# Patient Record
Sex: Female | Born: 1990 | Race: Black or African American | Hispanic: No | Marital: Single | Smoking: Former smoker
Health system: Southern US, Community
[De-identification: ages and names within clinical notes are randomized; demographics above are authoritative.]

## PROBLEM LIST (undated history)

## (undated) ENCOUNTER — Inpatient Hospital Stay (HOSPITAL_COMMUNITY): Payer: Self-pay

## (undated) DIAGNOSIS — J45909 Unspecified asthma, uncomplicated: Secondary | ICD-10-CM

## (undated) DIAGNOSIS — F419 Anxiety disorder, unspecified: Secondary | ICD-10-CM

## (undated) DIAGNOSIS — I10 Essential (primary) hypertension: Secondary | ICD-10-CM

## (undated) DIAGNOSIS — F329 Major depressive disorder, single episode, unspecified: Secondary | ICD-10-CM

## (undated) DIAGNOSIS — F32A Depression, unspecified: Secondary | ICD-10-CM

## (undated) HISTORY — PX: DILATION AND EVACUATION: SHX1459

## (undated) HISTORY — DX: Anxiety disorder, unspecified: F41.9

## (undated) HISTORY — DX: Unspecified asthma, uncomplicated: J45.909

## (undated) HISTORY — PX: NO PAST SURGERIES: SHX2092

---

## 2013-09-02 ENCOUNTER — Other Ambulatory Visit: Payer: Self-pay | Admitting: Nurse Practitioner

## 2014-04-15 DIAGNOSIS — F329 Major depressive disorder, single episode, unspecified: Secondary | ICD-10-CM | POA: Insufficient documentation

## 2014-11-06 ENCOUNTER — Emergency Department (HOSPITAL_COMMUNITY)
Admission: EM | Admit: 2014-11-06 | Discharge: 2014-11-07 | Disposition: A | Discharge status: Home / Self Care | Payer: BC Managed Care – PPO | Attending: Emergency Medicine | Admitting: Emergency Medicine

## 2014-11-06 ENCOUNTER — Encounter (HOSPITAL_COMMUNITY): Payer: Self-pay | Admitting: *Deleted

## 2014-11-06 DIAGNOSIS — T148XXA Other injury of unspecified body region, initial encounter: Secondary | ICD-10-CM

## 2014-11-06 DIAGNOSIS — Y998 Other external cause status: Secondary | ICD-10-CM | POA: Insufficient documentation

## 2014-11-06 DIAGNOSIS — S76012A Strain of muscle, fascia and tendon of left hip, initial encounter: Secondary | ICD-10-CM | POA: Diagnosis not present

## 2014-11-06 DIAGNOSIS — W010XXA Fall on same level from slipping, tripping and stumbling without subsequent striking against object, initial encounter: Secondary | ICD-10-CM | POA: Diagnosis not present

## 2014-11-06 DIAGNOSIS — Z720 Tobacco use: Secondary | ICD-10-CM | POA: Insufficient documentation

## 2014-11-06 DIAGNOSIS — Z3202 Encounter for pregnancy test, result negative: Secondary | ICD-10-CM | POA: Insufficient documentation

## 2014-11-06 DIAGNOSIS — Y9289 Other specified places as the place of occurrence of the external cause: Secondary | ICD-10-CM | POA: Insufficient documentation

## 2014-11-06 DIAGNOSIS — M25559 Pain in unspecified hip: Secondary | ICD-10-CM

## 2014-11-06 DIAGNOSIS — Y9389 Activity, other specified: Secondary | ICD-10-CM | POA: Diagnosis not present

## 2014-11-06 DIAGNOSIS — S8992XA Unspecified injury of left lower leg, initial encounter: Secondary | ICD-10-CM | POA: Insufficient documentation

## 2014-11-06 DIAGNOSIS — S79912A Unspecified injury of left hip, initial encounter: Secondary | ICD-10-CM | POA: Diagnosis present

## 2014-11-06 NOTE — ED Notes (Signed)
Pt reports tripping and falling earlier today.  Reports that she landed in a sitting position.  Pt now reporting pain in left leg, radiating from buttocks to thigh.  No difficulty noted with ambulation.

## 2014-11-07 ENCOUNTER — Emergency Department (HOSPITAL_COMMUNITY): Payer: BC Managed Care – PPO

## 2014-11-07 LAB — POC URINE PREG, ED: Preg Test, Ur: NEGATIVE

## 2014-11-07 MED ORDER — IBUPROFEN 800 MG PO TABS: 800.0000 mg | ORAL_TABLET | Freq: Once | ORAL | Status: DC

## 2014-11-07 MED ORDER — HYDROCODONE-ACETAMINOPHEN 5-325 MG PO TABS: 2.0000 | ORAL_TABLET | Freq: Once | ORAL | Status: DC

## 2014-11-07 MED ORDER — ONDANSETRON HCL 4 MG PO TABS: 4.0000 mg | ORAL_TABLET | Freq: Once | ORAL | Status: DC
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
## 2014-11-07 NOTE — ED Notes (Signed)
Pt decided she did not wish to stay to receive test results or additional treatment.  Encouraged pt to stay but she stated "I have a real long ride ahead of me".

## 2015-01-05 ENCOUNTER — Telehealth: Payer: Self-pay | Admitting: Family Medicine

## 2015-01-05 NOTE — Telephone Encounter (Signed)
Patient aware that our first appointment for a new patient will be in the middle of March and patient states she will continue to look around.

## 2015-06-30 ENCOUNTER — Encounter (HOSPITAL_COMMUNITY): Payer: Self-pay | Admitting: Emergency Medicine

## 2015-06-30 ENCOUNTER — Emergency Department (INDEPENDENT_AMBULATORY_CARE_PROVIDER_SITE_OTHER)
Admission: EM | Admit: 2015-06-30 | Discharge: 2015-06-30 | Disposition: A | Discharge status: Home / Self Care | Payer: Medicaid Other | Source: Home / Self Care | Attending: Emergency Medicine | Admitting: Emergency Medicine

## 2015-06-30 DIAGNOSIS — S01311A Laceration without foreign body of right ear, initial encounter: Secondary | ICD-10-CM

## 2015-06-30 MED ORDER — AMOXICILLIN-POT CLAVULANATE 875-125 MG PO TABS: 1.0000 | ORAL_TABLET | Freq: Two times a day (BID) | ORAL | Status: DC

## 2015-06-30 MED ORDER — TRAMADOL HCL 50 MG PO TABS: 50.0000 mg | ORAL_TABLET | Freq: Four times a day (QID) | ORAL | Status: DC | PRN
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
## 2015-06-30 NOTE — Discharge Instructions (Signed)
Laceration Care, Adult °A laceration is a cut that goes through all layers of the skin. The cut goes into the tissue beneath the skin. °HOME CARE °For wound glue: °· You may shower or take baths. Do not soak or scrub the cut. Do not swim. Avoid heavy sweating until the glue falls off on its own. After a shower or bath, pat the cut dry with a clean towel. °· Do not put medicine on your cut until the glue falls off. °· If you have a bandage, do not put tape over the glue. °· Avoid lots of sunlight or tanning lamps until the glue falls off. Put sunscreen on the cut for the first year to reduce your scar. °· The glue will fall off on its own. Do not pick at the glue. °You may need a tetanus shot if: °· You cannot remember when you had your last tetanus shot. °· You have never had a tetanus shot. °If you need a tetanus shot and you choose not to have one, you may get tetanus. Sickness from tetanus can be serious. °GET HELP RIGHT AWAY IF:  °· Your pain does not get better with medicine. °· Your arm, hand, leg, or foot loses feeling (numbness) or changes color. °· Your cut is bleeding. °· Your joint feels weak, or you cannot use your joint. °· You have painful lumps on your body. °· Your cut is red, puffy (swollen), or painful. °· You have a red line on the skin near the cut. °· You have yellowish-white fluid (pus) coming from the cut. °· You have a fever. °· You have a bad smell coming from the cut or bandage. °· Your cut breaks open before or after stitches are removed. °· You notice something coming out of the cut, such as wood or glass. °· You cannot move a finger or toe. °MAKE SURE YOU:  °· Understand these instructions. °· Will watch your condition. °· Will get help right away if you are not doing well or get worse. °Document Released: 05/30/2008 Document Revised: 03/05/2012 Document Reviewed: 06/07/2011 °Exit[employer]® Patient Information ©2015 [employer]. This information is not intended to replace advice given to  you by your health care provider. Make sure you discuss any questions you have with your health care provider. ° °
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
## 2016-02-11 ENCOUNTER — Encounter (HOSPITAL_COMMUNITY): Payer: Self-pay | Admitting: *Deleted

## 2016-02-11 ENCOUNTER — Inpatient Hospital Stay (HOSPITAL_COMMUNITY): Payer: BLUE CROSS/BLUE SHIELD

## 2016-02-11 ENCOUNTER — Inpatient Hospital Stay (HOSPITAL_COMMUNITY)
Admission: AD | Admit: 2016-02-11 | Discharge: 2016-02-11 | Disposition: A | Discharge status: Home / Self Care | Payer: BLUE CROSS/BLUE SHIELD | Source: Ambulatory Visit | Attending: Obstetrics & Gynecology | Admitting: Obstetrics & Gynecology

## 2016-02-11 DIAGNOSIS — F17210 Nicotine dependence, cigarettes, uncomplicated: Secondary | ICD-10-CM | POA: Diagnosis not present

## 2016-02-11 DIAGNOSIS — O9989 Other specified diseases and conditions complicating pregnancy, childbirth and the puerperium: Secondary | ICD-10-CM

## 2016-02-11 DIAGNOSIS — O26899 Other specified pregnancy related conditions, unspecified trimester: Secondary | ICD-10-CM

## 2016-02-11 DIAGNOSIS — R1030 Lower abdominal pain, unspecified: Secondary | ICD-10-CM | POA: Diagnosis present

## 2016-02-11 DIAGNOSIS — R109 Unspecified abdominal pain: Secondary | ICD-10-CM | POA: Diagnosis not present

## 2016-02-11 DIAGNOSIS — Z3A01 Less than 8 weeks gestation of pregnancy: Secondary | ICD-10-CM | POA: Diagnosis not present

## 2016-02-11 DIAGNOSIS — O26891 Other specified pregnancy related conditions, first trimester: Secondary | ICD-10-CM | POA: Diagnosis not present

## 2016-02-11 LAB — CBC WITH DIFFERENTIAL/PLATELET
BASOS ABS: 0 10*3/uL (ref 0.0–0.1)
BASOS PCT: 0 %
EOS ABS: 0.2 10*3/uL (ref 0.0–0.7)
Eosinophils Relative: 2 %
HEMATOCRIT: 39.1 % (ref 36.0–46.0)
HEMOGLOBIN: 13.9 g/dL (ref 12.0–15.0)
Lymphocytes Relative: 28 %
Lymphs Abs: 2.3 10*3/uL (ref 0.7–4.0)
MCH: 30.3 pg (ref 26.0–34.0)
MCHC: 35.5 g/dL (ref 30.0–36.0)
MCV: 85.4 fL (ref 78.0–100.0)
MONOS PCT: 5 %
Monocytes Absolute: 0.4 10*3/uL (ref 0.1–1.0)
Neutro Abs: 5.1 10*3/uL (ref 1.7–7.7)
Neutrophils Relative %: 65 %
Platelets: 288 10*3/uL (ref 150–400)
RBC: 4.58 MIL/uL (ref 3.87–5.11)
RDW: 12.2 % (ref 11.5–15.5)
WBC: 8 10*3/uL (ref 4.0–10.5)

## 2016-02-11 LAB — HCG, QUANTITATIVE, PREGNANCY: hCG, Beta Chain, Quant, S: 37435 m[IU]/mL — ABNORMAL HIGH (ref <5)

## 2016-02-11 LAB — WET PREP, GENITAL
Sperm: NONE SEEN
Trich, Wet Prep: NONE SEEN
YEAST WET PREP: NONE SEEN

## 2016-02-11 LAB — URINALYSIS, ROUTINE W REFLEX MICROSCOPIC
Bilirubin Urine: NEGATIVE
Glucose, UA: NEGATIVE mg/dL
Hgb urine dipstick: NEGATIVE
KETONES UR: 15 mg/dL — AB
Leukocytes, UA: NEGATIVE
NITRITE: NEGATIVE
PROTEIN: NEGATIVE mg/dL
Specific Gravity, Urine: 1.02 (ref 1.005–1.030)
pH: 7 (ref 5.0–8.0)

## 2016-02-11 LAB — POCT PREGNANCY, URINE: Preg Test, Ur: POSITIVE — AB

## 2016-02-11 NOTE — MAU Note (Signed)
PT SAYS   SHE STARTED HAVING  ABD PAIN ON MON-   NOT ALL TIME  -  NONE  NOW.   NO BLEEDING.   NO BIRTH CONTROL.   LAST SEX-  2 WEEKS AGO.    HPT DONE  ON 1-25  - POSITIVE   X2  .     NO DR.

## 2016-02-11 NOTE — Discharge Instructions (Signed)
Prenatal Care Providers °[facility]    [facility] ° Phone- [phone]     Phone: [phone] °         °[facility]                      [facility] ° @[place]     Phone: [phone] ° Phone: [phone] °        [facility] °[facility]     Phone: [phone] ° Phone: [phone]   °        [facility] °[facility] [place]                hone: [phone] ° Phone: [phone] °        [facility] °Dr. Bernard Marshall      Phone: [phone] ° Phone: [phone] °        [facility] °[facility]                Phone: [phone] ° [facility]  ° Phone:[phone]    [facility] °         Phone: [phone] °[facility] °  Phone: [phone] °Safe Medications in Pregnancy  ° °Acne: °Benzoyl Peroxide °Salicylic Acid ° °Backache/Headache: °Tylenol: 2 regular strength every 4 hours OR °             2 Extra strength every 6 hours ° °Colds/Coughs/Allergies: °Benadryl (alcohol free) 25 mg every 6 hours as needed °Breath right strips °Claritin °Cepacol throat lozenges °Chloraseptic throat spray °Cold-Eeze- up to three times per day °Cough drops, alcohol free °Flonase (by prescription only) °Guaifenesin °Mucinex °Robitussin DM (plain only, alcohol free) °Saline nasal spray/drops °Sudafed (pseudoephedrine) & Actifed ** use only after [redacted] weeks gestation and if you do not have high blood pressure °Tylenol °Vicks Vaporub °Zinc lozenges °Zyrtec  ° °Constipation: °Colace °Ducolax suppositories °Fleet enema °Glycerin suppositories °Metamucil °Milk of magnesia °Miralax °Senokot °Smooth move tea ° °Diarrhea: °Kaopectate °Imodium A-D ° °*NO pepto Bismol ° °Hemorrhoids: °Anusol °Anusol HC °Preparation H °Tucks ° °Indigestion: °Tums °Maalox °Mylanta °Zantac  °Pepcid ° °Insomnia: °Benadryl (alcohol free) 25mg every 6 hours as needed °Tylenol  PM °Unisom, no Gelcaps ° °Leg Cramps: °Tums °MagGel ° °Nausea/Vomiting:  °Bonine °Dramamine °Emetrol °Ginger extract °Sea bands °Meclizine  °Nausea medication to take during pregnancy:  °Unisom (doxylamine succinate 25 mg tablets) Take one tablet daily at bedtime. If symptoms are not adequately controlled, the dose can be increased to a maximum recommended dose of two tablets daily (1/2 tablet in the morning, 1/2 tablet mid-afternoon and one at bedtime). °Vitamin B6 100mg tablets. Take one tablet twice a day (up to 200 mg per day). ° °Skin Rashes: °Aveeno products °Benadryl cream or 25mg every 6 hours as needed °Calamine Lotion °1% cortisone cream ° °Yeast infection: °Gyne-lotrimin 7 °Monistat 7 ° ° °**If taking multiple medications, please check labels to avoid duplicating the same active ingredients °**take medication as directed on the label °** Do not exceed 4000 mg of tylenol in 24 hours °**Do not take medications that contain aspirin or ibuprofen ° ° ° ° °

## 2016-02-11 NOTE — MAU Provider Note (Signed)
History     CSN: [id number]  Arrival date and time: 02/11/16 7829   First Provider Initiated Contact with Patient 02/11/16 2025      Chief Complaint  Patient presents with  . Abdominal Pain   HPI  [name] is a 25 y.o. [id number] at [redacted]w[redacted]d who presents to MAU today with complaint of lower abdominal pain and +HPT. The patient states recent SAB, so lower abdominal pain was concerning for similar problem with this pregnancy. She state midline lower abdominal pain recently, although none now. She denies vaginal bleeding, discharge, UTI symptoms, fever, N/V/D or constipation. She states LMP was approximately 12/31/15.   OB History    Gravida Para Term Preterm AB TAB SAB Ectopic Multiple Living   History reviewed. No pertinent past medical history.  History reviewed. No pertinent past surgical history.  History reviewed. No pertinent family history.  Social History  Substance Use Topics  . Smoking status: Current Some Day Smoker -- 0.50 packs/day  . Smokeless tobacco: None  . Alcohol Use: No    Allergies: No Known Allergies  Prescriptions prior to admission  Medication Sig Dispense Refill Last Dose  . amoxicillin-clavulanate (AUGMENTIN) 875-125 MG per tablet Take 1 tablet by mouth 2 (two) times daily. 20 tablet 0   . traMADol (ULTRAM) 50 MG tablet Take 1 tablet (50 mg total) by mouth every 6 (six) hours as needed. 15 tablet 0     Review of Systems  Constitutional: Negative for fever and malaise/fatigue.  Gastrointestinal: Positive for abdominal pain. Negative for nausea, vomiting, diarrhea and constipation.  Genitourinary: Negative for dysuria, urgency and frequency.       Neg - vaginal bleeding, discharge   Physical Exam   Blood pressure 148/99, pulse 92, temperature 98.3 F (36.8 C), temperature source Oral, resp. rate 20, height  (1.626 m), weight 161 lb 8 oz (73.256 kg), last menstrual period 12/31/2015.  Physical Exam  Nursing  note and vitals reviewed. Constitutional: She is oriented to person, place, and time. She appears well-developed and well-nourished. No distress.  HENT:  Head: Normocephalic and atraumatic.  Cardiovascular: Normal rate.   Respiratory: Effort normal.  GI: Soft. She exhibits no distension and no mass. There is tenderness (mild tedneress to palpation of the lower abdomen at midline). There is no rebound and no guarding.  Genitourinary: Uterus is not enlarged and not tender. Cervix exhibits no motion tenderness. Right adnexum displays no mass and no tenderness. Left adnexum displays no mass and no tenderness. No bleeding in the vagina. Vaginal discharge (scant thin, white discharge) found.  Neurological: She is alert and oriented to person, place, and time.  Skin: Skin is warm and dry. No erythema.  Psychiatric: She has a normal mood and affect.   Results for orders placed or performed during the hospital encounter of 02/11/16 (from the past 24 hour(s))  Urinalysis, Routine w reflex microscopic (not at [facility])     Status: Abnormal   Collection Time: 02/11/16  7:45 PM  Result Value Ref Range   Color, Urine YELLOW YELLOW   APPearance CLEAR CLEAR   Specific Gravity, Urine 1.020 1.005 - 1.030   pH 7.0 5.0 - 8.0   Glucose, UA NEGATIVE NEGATIVE mg/dL   Hgb urine dipstick NEGATIVE NEGATIVE   Bilirubin Urine NEGATIVE NEGATIVE   Ketones, ur 15 (A) NEGATIVE mg/dL   Protein, ur NEGATIVE NEGATIVE mg/dL   Nitrite NEGATIVE NEGATIVE  Leukocytes, UA NEGATIVE NEGATIVE  Pregnancy, urine POC     Status: Abnormal   Collection Time: 02/11/16  8:15 PM  Result Value Ref Range   Preg Test, Ur POSITIVE (A) NEGATIVE  CBC with Differential/Platelet     Status: None   Collection Time: 02/11/16  8:29 PM  Result Value Ref Range   WBC 8.0 4.0 - 10.5 K/uL   RBC 4.58 3.87 - 5.11 MIL/uL   Hemoglobin 13.9 12.0 - 15.0 g/dL   HCT 56.2 13.0 - 86.5 %   MCV 85.4 78.0 - 100.0 fL   MCH 30.3 26.0 - 34.0 pg   MCHC 35.5 30.0  - 36.0 g/dL   RDW 78.4 69.6 - 29.5 %   Platelets 288 150 - 400 K/uL   Neutrophils Relative % 65 %   Neutro Abs 5.1 1.7 - 7.7 K/uL   Lymphocytes Relative 28 %   Lymphs Abs 2.3 0.7 - 4.0 K/uL   Monocytes Relative 5 %   Monocytes Absolute 0.4 0.1 - 1.0 K/uL   Eosinophils Relative 2 %   Eosinophils Absolute 0.2 0.0 - 0.7 K/uL   Basophils Relative 0 %   Basophils Absolute 0.0 0.0 - 0.1 K/uL  ABO/Rh     Status: None (Preliminary result)   Collection Time: 02/11/16  8:29 PM  Result Value Ref Range   ABO/RH(D) O POS   hCG, quantitative, pregnancy     Status: Abnormal   Collection Time: 02/11/16  8:29 PM  Result Value Ref Range   hCG, Beta Chain, Quant, S 37435 (H) <5 mIU/mL  Wet prep, genital     Status: Abnormal   Collection Time: 02/11/16  8:55 PM  Result Value Ref Range   Yeast Wet Prep HPF POC NONE SEEN NONE SEEN   Trich, Wet Prep NONE SEEN NONE SEEN   Clue Cells Wet Prep HPF POC PRESENT (A) NONE SEEN   WBC, Wet Prep HPF POC FEW (A) NONE SEEN   Sperm NONE SEEN    US Ob Comp Less 14 Wks  02/11/2016  CLINICAL DATA:  Abdominal pain in first-trimester pregnancy EXAM: OBSTETRIC <14 WK [place] AND TRANSVAGINAL OB US TECHNIQUE: Both transabdominal and transvaginal ultrasound examinations were performed for complete evaluation of the gestation as well as the maternal uterus, adnexal regions, and pelvic cul-de-sac. Transvaginal technique was performed to assess early pregnancy. COMPARISON:  None. FINDINGS: Intrauterine gestational sac: Visualized/normal in shape. Yolk sac:  Present Embryo:  Present Cardiac Activity: Present Heart Rate: 122  bpm CRL:  6  mm   6 w   3 d                  [place] EDC: 10/03/2016 Subchorionic hemorrhage:  Present inferiorly, 19 x 6 x 32 mm Maternal uterus/adnexae: 12 mm intramural fibroid noted, closest to the serosal surface. Corpus luteum on the right. IMPRESSION: 1. Single living intrauterine pregnancy measuring 6 weeks 3 days. 2. 6 x 19 x 32 mm subchorionic hematoma. 3. 12 mm  intramural fibroid. Electronically Signed   By: Marnee Spring M.D.   On: 02/11/2016 21:58   US Ob Transvaginal  02/11/2016  CLINICAL DATA:  Abdominal pain in first-trimester pregnancy EXAM: OBSTETRIC <14 WK [place] AND TRANSVAGINAL OB US TECHNIQUE: Both transabdominal and transvaginal ultrasound examinations were performed for complete evaluation of the gestation as well as the maternal uterus, adnexal regions, and pelvic cul-de-sac. Transvaginal technique was performed to assess early pregnancy. COMPARISON:  None. FINDINGS: Intrauterine gestational sac: Visualized/normal in shape. Yolk  sac:  Present Embryo:  Present Cardiac Activity: Present Heart Rate: 122  bpm CRL:  6  mm   6 w   3 d                  [place] EDC: 10/03/2016 Subchorionic hemorrhage:  Present inferiorly, 19 x 6 x 32 mm Maternal uterus/adnexae: 12 mm intramural fibroid noted, closest to the serosal surface. Corpus luteum on the right. IMPRESSION: 1. Single living intrauterine pregnancy measuring 6 weeks 3 days. 2. 6 x 19 x 32 mm subchorionic hematoma. 3. 12 mm intramural fibroid. Electronically Signed   By: Marnee Spring M.D.   On: 02/11/2016 21:58     MAU Course  Procedures None   MDM +UPT UA, wet prep, GC/chlamydia, CBC, ABO/Rh, quant hCG, HIV, RPR and [place] today to rule out ectopic pregnancy 2100 - Labs pending. Patient waiting for [place]. Care turned over to [facility], CNM   Marny Lowenstein, PA-C  02/11/2016, 9:00 PM  Assessment and Plan   1. Abdominal pain affecting pregnancy, antepartum    DC home Comfort measures reviewed  1st Trimester precautions  Bleeding precautions RX: none  Return to MAU as needed FU with OB as planned  Follow-up Information    Schedule an appointment as soon as possible for a visit with [facility].   Contact information:   [address] [place] [address] [phone]

## 2016-02-12 LAB — GC/CHLAMYDIA PROBE AMP (~~LOC~~) NOT AT ARMC
CHLAMYDIA, DNA PROBE: NEGATIVE
Neisseria Gonorrhea: NEGATIVE

## 2016-02-12 LAB — ABO/RH: ABO/RH(D): O POS

## 2016-02-12 LAB — SYPHILIS: RPR W/REFLEX TO RPR TITER AND TREPONEMAL ANTIBODIES, TRADITIONAL SCREENING AND DIAGNOSIS ALGORITHM: RPR Ser Ql: NONREACTIVE

## 2016-02-12 LAB — HIV ANTIBODY (ROUTINE TESTING W REFLEX): HIV Screen 4th Generation wRfx: NONREACTIVE

## 2016-03-03 ENCOUNTER — Inpatient Hospital Stay (HOSPITAL_COMMUNITY)
Admission: AD | Admit: 2016-03-03 | Discharge: 2016-03-04 | Disposition: A | Discharge status: Home / Self Care | Payer: BLUE CROSS/BLUE SHIELD | Source: Ambulatory Visit | Attending: Obstetrics & Gynecology | Admitting: Obstetrics & Gynecology

## 2016-03-03 DIAGNOSIS — R079 Chest pain, unspecified: Secondary | ICD-10-CM | POA: Diagnosis present

## 2016-03-03 DIAGNOSIS — Z87891 Personal history of nicotine dependence: Secondary | ICD-10-CM | POA: Diagnosis not present

## 2016-03-03 DIAGNOSIS — O10011 Pre-existing essential hypertension complicating pregnancy, first trimester: Secondary | ICD-10-CM | POA: Diagnosis not present

## 2016-03-03 DIAGNOSIS — O131 Gestational [pregnancy-induced] hypertension without significant proteinuria, first trimester: Secondary | ICD-10-CM

## 2016-03-03 DIAGNOSIS — Z3A09 9 weeks gestation of pregnancy: Secondary | ICD-10-CM | POA: Diagnosis not present

## 2016-03-03 DIAGNOSIS — I10 Essential (primary) hypertension: Secondary | ICD-10-CM

## 2016-03-03 LAB — TROPONIN I

## 2016-03-03 MED ORDER — LABETALOL HCL 100 MG PO TABS
100.0000 mg | ORAL_TABLET | Freq: Once | ORAL | Status: AC
Start: 2016-03-03 — End: 2016-03-03
  Administered 2016-03-03: 100 mg via ORAL
  Filled 2016-03-03: qty 1

## 2016-03-03 MED ORDER — LABETALOL HCL 100 MG PO TABS: 100.0000 mg | ORAL_TABLET | Freq: Two times a day (BID) | ORAL | Status: DC

## 2016-03-03 MED ORDER — LABETALOL HCL 100 MG PO TABS: 200.0000 mg | ORAL_TABLET | Freq: Once | ORAL | Status: DC

## 2016-03-03 NOTE — MAU Note (Signed)
Urine in lab 

## 2016-03-03 NOTE — MAU Provider Note (Signed)
History     CSN: [id number]  Arrival date and time: 03/03/16 1850   First Provider Initiated Contact with Patient 03/03/16 2050      Chief Complaint  Patient presents with  . Chest Pain   HPI Comments: Deborah Klein is a 25 y.o. [id number] at [redacted]w[redacted]d who presents today with chest pain. She states that she has had the pain for about 2 weeks, but the last 2 days it has been increasing in frequency. She states that every time she is at the doctor she is told that her blood pressure is "a little high".   Chest Pain  This is a new problem. Episode onset: 2 weeks ago. The onset quality is gradual. The problem occurs intermittently (but increasing in frequency in the last 1-2 days ). The pain is present in the lateral region. The pain is at a severity of 7/10. The quality of the pain is described as squeezing and sharp. The pain radiates to the left arm. Associated symptoms include headaches, nausea and vomiting. Pertinent negatives include no cough. Cough associated with: quit smokig about 2 weeks ago.  The pain is aggravated by nothing. She has tried nothing for the symptoms. Risk factors include smoking/tobacco exposure (pregnancy ).   No past medical history on file.  No past surgical history on file.  No family history on file.  Social History  Substance Use Topics  . Smoking status: Current Some Day Smoker -- 0.50 packs/day  . Smokeless tobacco: Not on file  . Alcohol Use: No    Allergies: No Known Allergies  No prescriptions prior to admission    Review of Systems  Eyes: Negative for blurred vision.  Respiratory: Negative for cough.   Cardiovascular: Positive for chest pain.  Gastrointestinal: Positive for nausea and vomiting.  Neurological: Positive for headaches.   Physical Exam   Blood pressure 150/95, pulse 70, temperature 97.9 F (36.6 C), temperature source Oral, resp. rate 16, height  (1.676 m), weight 75.354 kg (166 lb 2 oz), last menstrual period 12/31/2015,  SpO2 100 %.  Physical Exam  Nursing note and vitals reviewed. Constitutional: She is oriented to person, place, and time. She appears well-developed and well-nourished. No distress.  HENT:  Head: Normocephalic.  Cardiovascular: Normal rate.   Respiratory: Effort normal.  GI: Soft. There is no tenderness. There is no rebound.  Neurological: She is alert and oriented to person, place, and time.  Skin: Skin is warm and dry.  Psychiatric: She has a normal mood and affect.   Results for orders placed or performed during the hospital encounter of 03/03/16 (from the past 24 hour(s))  Troponin I     Status: None   Collection Time: 03/03/16  9:50 PM  Result Value Ref Range   Troponin I <0.03 <0.031 ng/mL    MAU Course  Procedures  MDM 2112: D/W Dr. Penne Lash, will give 100 mg labetalol now, and can be dc home on  BID if cardiology signs off on EKG 2146: D/W Dr. Nicholaus Bloom reviewed ECG. He feels that she will need an echo. Will get troponin now, and repeat ECG with any changes or if pain improved.  2254: Patient reports that pain is now 4/10, was 7/10 when first arrived.  2352: Patient reports that her pain is now 0/10  2354: D/W Dr. Nicholaus Bloom, ok for DC home. Patient to FU with cardiology for outpatient FU.  Assessment and Plan   1. Essential hypertension    DC home Comfort measures reviewed  1st Trimester precautions  Warning signs and return precautions reviewed  RX: labetalol 100mg  BID  Return to MAU as needed for emergencies.    Follow-up Information    Follow [facility] with [facility].   Specialty:  Obstetrics and Gynecology   Why:  They will call you with an appointment   Contact information:   [address] [place] [id number] [phone]      Schedule an appointment as soon as possible for a visit with [facility].   Contact information:   [address] [place]  [phone]      Tawnya CrookHogan, [place] Zachow Donovan 03/03/2016, 8:51 PM

## 2016-03-03 NOTE — MAU Note (Signed)
Pt reports she has been having chest pain off/on for the last 3 weeks, states it feels like a tightness in her chest but it is worsening for the last 24 hours, pt is texting during triage conversation. Has a history of asthma and states she sometimes has shortness of breath with the pain.

## 2016-03-03 NOTE — MAU Note (Signed)
Not in lobby

## 2016-03-03 NOTE — Discharge Instructions (Signed)
Hypertension Hypertension, commonly called high blood pressure, is when the force of blood pumping through your arteries is too strong. Your arteries are the blood vessels that carry blood from your heart throughout your body. A blood pressure reading consists of a higher number over a lower number, such as 110/72. The higher number (systolic) is the pressure inside your arteries when your heart pumps. The lower number (diastolic) is the pressure inside your arteries when your heart relaxes. Ideally you want your blood pressure below 120/80. Hypertension forces your heart to work harder to pump blood. Your arteries may become narrow or stiff. Having untreated or uncontrolled hypertension can cause heart attack, stroke, kidney disease, and other problems. RISK FACTORS Some risk factors for high blood pressure are controllable. Others are not.  Risk factors you cannot control include:   Race. You may be at higher risk if you are African American.  Age. Risk increases with age.  Gender. Men are at higher risk than women before age 45 years. After age 65, women are at higher risk than men. Risk factors you can control include:  Not getting enough exercise or physical activity.  Being overweight.  Getting too much fat, sugar, calories, or salt in your diet.  Drinking too much alcohol. SIGNS AND SYMPTOMS Hypertension does not usually cause signs or symptoms. Extremely high blood pressure (hypertensive crisis) may cause headache, anxiety, shortness of breath, and nosebleed. DIAGNOSIS To check if you have hypertension, your health care provider will measure your blood pressure while you are seated, with your arm held at the level of your heart. It should be measured at least twice using the same arm. Certain conditions can cause a difference in blood pressure between your right and left arms. A blood pressure reading that is higher than normal on one occasion does not mean that you need treatment. If  it is not clear whether you have high blood pressure, you may be asked to return on a different day to have your blood pressure checked again. Or, you may be asked to monitor your blood pressure at home for 1 or more weeks. TREATMENT Treating high blood pressure includes making lifestyle changes and possibly taking medicine. Living a healthy lifestyle can help lower high blood pressure. You may need to change some of your habits. Lifestyle changes may include:  Following the DASH diet. This diet is high in fruits, vegetables, and whole grains. It is low in salt, red meat, and added sugars.  Keep your sodium intake below 2,300 mg per day.  Getting at least 30-45 minutes of aerobic exercise at least 4 times per week.  Losing weight if necessary.  Not smoking.  Limiting alcoholic beverages.  Learning ways to reduce stress. Your health care provider may prescribe medicine if lifestyle changes are not enough to get your blood pressure under control, and if one of the following is true:  You are 18-59 years of age and your systolic blood pressure is above 140.  You are 60 years of age or older, and your systolic blood pressure is above 150.  Your diastolic blood pressure is above 90.  You have diabetes, and your systolic blood pressure is over 140 or your diastolic blood pressure is over 90.  You have kidney disease and your blood pressure is above 140/90.  You have heart disease and your blood pressure is above 140/90. Your personal target blood pressure may vary depending on your medical conditions, your age, and other factors. HOME CARE INSTRUCTIONS    Have your blood pressure rechecked as directed by your health care provider.   Take medicines only as directed by your health care provider. Follow the directions carefully. Blood pressure medicines must be taken as prescribed. The medicine does not work as well when you skip doses. Skipping doses also puts you at risk for  problems.  Do not smoke.   Monitor your blood pressure at home as directed by your health care provider. SEEK MEDICAL CARE IF:   You think you are having a reaction to medicines taken.  You have recurrent headaches or feel dizzy.  You have swelling in your ankles.  You have trouble with your vision. SEEK IMMEDIATE MEDICAL CARE IF:  You develop a severe headache or confusion.  You have unusual weakness, numbness, or feel faint.  You have severe chest or abdominal pain.  You vomit repeatedly.  You have trouble breathing. MAKE SURE YOU:   Understand these instructions.  Will watch your condition.  Will get help right away if you are not doing well or get worse.   This information is not intended to replace advice given to you by your health care provider. Make sure you discuss any questions you have with your health care provider.   Document Released: 12/12/2005 Document Revised: 04/28/2015 Document Reviewed: 10/04/2013 Elsevier Interactive Patient Education 2016 [employer] First Trimester of Pregnancy The first trimester of pregnancy is from week 1 until the end of week 12 (months 1 through 3). A week after a sperm fertilizes an egg, the egg will implant on the wall of the uterus. This embryo will begin to develop into a baby. Genes from you and your partner are forming the baby. The female genes determine whether the baby is a boy or a girl. At 6-8 weeks, the eyes and face are formed, and the heartbeat can be seen on ultrasound. At the end of 12 weeks, all the baby's organs are formed.  Now that you are pregnant, you will want to do everything you can to have a healthy baby. Two of the most important things are to get good prenatal care and to follow your health care provider's instructions. Prenatal care is all the medical care you receive before the baby's birth. This care will help prevent, find, and treat any problems during the pregnancy and childbirth. BODY  CHANGES Your body goes through many changes during pregnancy. The changes vary from woman to woman.   You may gain or lose a couple of pounds at first.  You may feel sick to your stomach (nauseous) and throw up (vomit). If the vomiting is uncontrollable, call your health care provider.  You may tire easily.  You may develop headaches that can be relieved by medicines approved by your health care provider.  You may urinate more often. Painful urination may mean you have a bladder infection.  You may develop heartburn as a result of your pregnancy.  You may develop constipation because certain hormones are causing the muscles that push waste through your intestines to slow down.  You may develop hemorrhoids or swollen, bulging veins (varicose veins).  Your breasts may begin to grow larger and become tender. Your nipples may stick out more, and the tissue that surrounds them (areola) may become darker.  Your gums may bleed and may be sensitive to brushing and flossing.  Dark spots or blotches (chloasma, mask of pregnancy) may develop on your face. This will likely fade after the baby is born.  Your menstrual periods  will stop.  You may have a loss of appetite.  You may develop cravings for certain kinds of food.  You may have changes in your emotions from day to day, such as being excited to be pregnant or being concerned that something may go wrong with the pregnancy and baby.  You may have more vivid and strange dreams.  You may have changes in your hair. These can include thickening of your hair, rapid growth, and changes in texture. Some women also have hair loss during or after pregnancy, or hair that feels dry or thin. Your hair will most likely return to normal after your baby is born. WHAT TO EXPECT AT YOUR PRENATAL VISITS During a routine prenatal visit:  You will be weighed to make sure you and the baby are growing normally.  Your blood pressure will be taken.  Your  abdomen will be measured to track your baby's growth.  The fetal heartbeat will be listened to starting around week 10 or 12 of your pregnancy.  Test results from any previous visits will be discussed. Your health care provider may ask you:  How you are feeling.  If you are feeling the baby move.  If you have had any abnormal symptoms, such as leaking fluid, bleeding, severe headaches, or abdominal cramping.  If you are using any tobacco products, including cigarettes, chewing tobacco, and electronic cigarettes.  If you have any questions. Other tests that may be performed during your first trimester include:  Blood tests to find your blood type and to check for the presence of any previous infections. They will also be used to check for low iron levels (anemia) and Rh antibodies. Later in the pregnancy, blood tests for diabetes will be done along with other tests if problems develop.  Urine tests to check for infections, diabetes, or protein in the urine.  An ultrasound to confirm the proper growth and development of the baby.  An amniocentesis to check for possible genetic problems.  Fetal screens for spina bifida and Down syndrome.  You may need other tests to make sure you and the baby are doing well.  HIV (human immunodeficiency virus) testing. Routine prenatal testing includes screening for HIV, unless you choose not to have this test. HOME CARE INSTRUCTIONS  Medicines  Follow your health care provider's instructions regarding medicine use. Specific medicines may be either safe or unsafe to take during pregnancy.  Take your prenatal vitamins as directed.  If you develop constipation, try taking a stool softener if your health care provider approves. Diet  Eat regular, well-balanced meals. Choose a variety of foods, such as meat or vegetable-based protein, fish, milk and low-fat dairy products, vegetables, fruits, and whole grain breads and cereals. Your health care  provider will help you determine the amount of weight gain that is right for you.  Avoid raw meat and uncooked cheese. These carry germs that can cause birth defects in the baby.  Eating four or five small meals rather than three large meals a day may help relieve nausea and vomiting. If you start to feel nauseous, eating a few soda crackers can be helpful. Drinking liquids between meals instead of during meals also seems to help nausea and vomiting.  If you develop constipation, eat more high-fiber foods, such as fresh vegetables or fruit and whole grains. Drink enough fluids to keep your urine clear or pale yellow. Activity and Exercise  Exercise only as directed by your health care provider. Exercising will help you:  Control your weight.  Stay in shape.  Be prepared for labor and delivery.  Experiencing pain or cramping in the lower abdomen or low back is a good sign that you should stop exercising. Check with your health care provider before continuing normal exercises.  Try to avoid standing for long periods of time. Move your legs often if you must stand in one place for a long time.  Avoid heavy lifting.  Wear low-heeled shoes, and practice good posture.  You may continue to have sex unless your health care provider directs you otherwise. Relief of Pain or Discomfort  Wear a good support bra for breast tenderness.   Take warm sitz baths to soothe any pain or discomfort caused by hemorrhoids. Use hemorrhoid cream if your health care provider approves.   Rest with your legs elevated if you have leg cramps or low back pain.  If you develop varicose veins in your legs, wear support hose. Elevate your feet for 15 minutes, 3-4 times a day. Limit salt in your diet. Prenatal Care  Schedule your prenatal visits by the twelfth week of pregnancy. They are usually scheduled monthly at first, then more often in the last 2 months before delivery.  Write down your questions. Take  them to your prenatal visits.  Keep all your prenatal visits as directed by your health care provider. Safety  Wear your seat belt at all times when driving.  Make a list of emergency phone numbers, including numbers for family, friends, the hospital, and police and fire departments. General Tips  Ask your health care provider for a referral to a local prenatal education class. Begin classes no later than at the beginning of month 6 of your pregnancy.  Ask for help if you have counseling or nutritional needs during pregnancy. Your health care provider can offer advice or refer you to specialists for help with various needs.  Do not use hot tubs, steam rooms, or saunas.  Do not douche or use tampons or scented sanitary pads.  Do not cross your legs for long periods of time.  Avoid cat litter boxes and soil used by cats. These carry germs that can cause birth defects in the baby and possibly loss of the fetus by miscarriage or stillbirth.  Avoid all smoking, herbs, alcohol, and medicines not prescribed by your health care provider. Chemicals in these affect the formation and growth of the baby.  Do not use any tobacco products, including cigarettes, chewing tobacco, and electronic cigarettes. If you need help quitting, ask your health care provider. You may receive counseling support and other resources to help you quit.  Schedule a dentist appointment. At home, brush your teeth with a soft toothbrush and be gentle when you floss. SEEK MEDICAL CARE IF:   You have dizziness.  You have mild pelvic cramps, pelvic pressure, or nagging pain in the abdominal area.  You have persistent nausea, vomiting, or diarrhea.  You have a bad smelling vaginal discharge.  You have pain with urination.  You notice increased swelling in your face, hands, legs, or ankles. SEEK IMMEDIATE MEDICAL CARE IF:   You have a fever.  You are leaking fluid from your vagina.  You have spotting or bleeding  from your vagina.  You have severe abdominal cramping or pain.  You have rapid weight gain or loss.  You vomit blood or material that looks like coffee grounds.  You are exposed to [place] measles and have never had them.  You are exposed  to fifth disease or chickenpox.  You develop a severe headache.  You have shortness of breath.  You have any kind of trauma, such as from a fall or a car accident.   This information is not intended to replace advice given to you by your health care provider. Make sure you discuss any questions you have with your health care provider.   Document Released: 12/06/2001 Document Revised: 01/02/2015 Document Reviewed: 10/22/2013 Elsevier Interactive Patient Education [employer]

## 2016-03-03 NOTE — MAU Note (Signed)
EKG in triage

## 2016-03-24 ENCOUNTER — Encounter: Payer: BLUE CROSS/BLUE SHIELD | Admitting: Obstetrics and Gynecology

## 2016-06-20 ENCOUNTER — Emergency Department (HOSPITAL_COMMUNITY): Payer: Medicaid Other

## 2016-06-20 ENCOUNTER — Emergency Department (HOSPITAL_COMMUNITY)
Admission: EM | Admit: 2016-06-20 | Discharge: 2016-06-20 | Disposition: A | Discharge status: Home / Self Care | Payer: Medicaid Other | Attending: Emergency Medicine | Admitting: Emergency Medicine

## 2016-06-20 ENCOUNTER — Encounter (HOSPITAL_COMMUNITY): Payer: Self-pay | Admitting: Emergency Medicine

## 2016-06-20 DIAGNOSIS — Z3201 Encounter for pregnancy test, result positive: Secondary | ICD-10-CM | POA: Diagnosis not present

## 2016-06-20 DIAGNOSIS — N760 Acute vaginitis: Secondary | ICD-10-CM

## 2016-06-20 DIAGNOSIS — Z3A01 Less than 8 weeks gestation of pregnancy: Secondary | ICD-10-CM | POA: Insufficient documentation

## 2016-06-20 DIAGNOSIS — I10 Essential (primary) hypertension: Secondary | ICD-10-CM | POA: Insufficient documentation

## 2016-06-20 DIAGNOSIS — N898 Other specified noninflammatory disorders of vagina: Secondary | ICD-10-CM | POA: Insufficient documentation

## 2016-06-20 DIAGNOSIS — O99331 Smoking (tobacco) complicating pregnancy, first trimester: Secondary | ICD-10-CM | POA: Diagnosis not present

## 2016-06-20 DIAGNOSIS — F17200 Nicotine dependence, unspecified, uncomplicated: Secondary | ICD-10-CM | POA: Insufficient documentation

## 2016-06-20 DIAGNOSIS — R1030 Lower abdominal pain, unspecified: Secondary | ICD-10-CM | POA: Diagnosis present

## 2016-06-20 DIAGNOSIS — O26899 Other specified pregnancy related conditions, unspecified trimester: Secondary | ICD-10-CM

## 2016-06-20 DIAGNOSIS — F329 Major depressive disorder, single episode, unspecified: Secondary | ICD-10-CM | POA: Insufficient documentation

## 2016-06-20 DIAGNOSIS — O23591 Infection of other part of genital tract in pregnancy, first trimester: Secondary | ICD-10-CM | POA: Insufficient documentation

## 2016-06-20 DIAGNOSIS — R102 Pelvic and perineal pain: Secondary | ICD-10-CM

## 2016-06-20 DIAGNOSIS — Z3490 Encounter for supervision of normal pregnancy, unspecified, unspecified trimester: Secondary | ICD-10-CM

## 2016-06-20 DIAGNOSIS — B9689 Other specified bacterial agents as the cause of diseases classified elsewhere: Secondary | ICD-10-CM

## 2016-06-20 HISTORY — DX: Essential (primary) hypertension: I10

## 2016-06-20 HISTORY — DX: Depression, unspecified: F32.A

## 2016-06-20 HISTORY — DX: Major depressive disorder, single episode, unspecified: F32.9

## 2016-06-20 LAB — CBC WITH DIFFERENTIAL/PLATELET
BASOS ABS: 0 10*3/uL (ref 0.0–0.1)
Basophils Relative: 0 %
Eosinophils Absolute: 0.2 10*3/uL (ref 0.0–0.7)
Eosinophils Relative: 4 %
HEMATOCRIT: 39.6 % (ref 36.0–46.0)
HEMOGLOBIN: 13.7 g/dL (ref 12.0–15.0)
LYMPHS PCT: 28 %
Lymphs Abs: 1.2 10*3/uL (ref 0.7–4.0)
MCH: 30.1 pg (ref 26.0–34.0)
MCHC: 34.6 g/dL (ref 30.0–36.0)
MCV: 87 fL (ref 78.0–100.0)
MONO ABS: 0.5 10*3/uL (ref 0.1–1.0)
MONOS PCT: 13 %
NEUTROS ABS: 2.3 10*3/uL (ref 1.7–7.7)
Neutrophils Relative %: 55 %
Platelets: 230 10*3/uL (ref 150–400)
RBC: 4.55 MIL/uL (ref 3.87–5.11)
RDW: 12 % (ref 11.5–15.5)
WBC: 4.2 10*3/uL (ref 4.0–10.5)

## 2016-06-20 LAB — COMPREHENSIVE METABOLIC PANEL
ALK PHOS: 43 U/L (ref 38–126)
ALT: 17 U/L (ref 14–54)
AST: 15 U/L (ref 15–41)
Albumin: 4.1 g/dL (ref 3.5–5.0)
Anion gap: 5 (ref 5–15)
BILIRUBIN TOTAL: 0.9 mg/dL (ref 0.3–1.2)
BUN: 8 mg/dL (ref 6–20)
CALCIUM: 8.7 mg/dL — AB (ref 8.9–10.3)
CO2: 24 mmol/L (ref 22–32)
Chloride: 106 mmol/L (ref 101–111)
Creatinine, Ser: 0.59 mg/dL (ref 0.44–1.00)
GFR calc Af Amer: >60 mL/min (ref >60)
Glucose, Bld: 91 mg/dL (ref 65–99)
POTASSIUM: 3.5 mmol/L (ref 3.5–5.1)
Sodium: 135 mmol/L (ref 135–145)
TOTAL PROTEIN: 6.6 g/dL (ref 6.5–8.1)

## 2016-06-20 LAB — URINALYSIS, ROUTINE W REFLEX MICROSCOPIC
Bilirubin Urine: NEGATIVE
GLUCOSE, UA: NEGATIVE mg/dL
Hgb urine dipstick: NEGATIVE
Ketones, ur: NEGATIVE mg/dL
LEUKOCYTES UA: NEGATIVE
Nitrite: NEGATIVE
PH: 7 (ref 5.0–8.0)
Protein, ur: NEGATIVE mg/dL
SPECIFIC GRAVITY, URINE: 1.015 (ref 1.005–1.030)

## 2016-06-20 LAB — WET PREP, GENITAL
Sperm: NONE SEEN
Trich, Wet Prep: NONE SEEN
YEAST WET PREP: NONE SEEN

## 2016-06-20 LAB — PREGNANCY, URINE: Preg Test, Ur: POSITIVE — AB

## 2016-06-20 LAB — HCG, QUANTITATIVE, PREGNANCY: hCG, Beta Chain, Quant, S: 12666 m[IU]/mL — ABNORMAL HIGH (ref <5)

## 2016-06-20 MED ORDER — LABETALOL HCL 100 MG PO TABS: 100.0000 mg | ORAL_TABLET | Freq: Two times a day (BID) | ORAL | Status: DC

## 2016-06-20 MED ORDER — METRONIDAZOLE 0.75 % VA GEL: 1.0000 | Freq: Every day | VAGINAL | Status: AC

## 2016-06-20 MED ORDER — LABETALOL HCL 200 MG PO TABS
100.0000 mg | ORAL_TABLET | Freq: Two times a day (BID) | ORAL | Status: DC
  Administered 2016-06-20: 10:00:00 via ORAL
  Filled 2016-06-20: qty 1

## 2016-06-20 NOTE — ED Notes (Addendum)
Patient complaining of lower abdominal pain x 2 days. States she took home pregnancy test and it was positive but has not followed up with PCP.  States pain has resolved since arrival to ER. Patient's blood pressure elevated in triage. States "I stopped taking my blood pressure medicine a week ago because I have depression also and it makes me feel blah."

## 2016-06-20 NOTE — Discharge Instructions (Signed)
Abdominal Pain During Pregnancy °Abdominal pain is common in pregnancy. Most of the time, it does not cause harm. There are many causes of abdominal pain. Some causes are more serious than others. Some of the causes of abdominal pain in pregnancy are easily diagnosed. Occasionally, the diagnosis takes time to understand. Other times, the cause is not determined. Abdominal pain can be a sign that something is very wrong with the pregnancy, or the pain may have nothing to do with the pregnancy at all. For this reason, always tell your health care provider if you have any abdominal discomfort. °HOME CARE INSTRUCTIONS  °Monitor your abdominal pain for any changes. The following actions may help to alleviate any discomfort you are experiencing: °· Do not have sexual intercourse or put anything in your vagina until your symptoms go away completely. °· Get plenty of rest until your pain improves. °· Drink clear fluids if you feel nauseous. Avoid solid food as long as you are uncomfortable or nauseous. °· Only take over-the-counter or prescription medicine as directed by your health care provider. °· Keep all follow-up appointments with your health care provider. °SEEK IMMEDIATE MEDICAL CARE IF: °· You are bleeding, leaking fluid, or passing tissue from the vagina. °· You have increasing pain or cramping. °· You have persistent vomiting. °· You have painful or bloody urination. °· You have a fever. °· You notice a decrease in your baby's movements. °· You have extreme weakness or feel faint. °· You have shortness of breath, with or without abdominal pain. °· You develop a severe headache with abdominal pain. °· You have abnormal vaginal discharge with abdominal pain. °· You have persistent diarrhea. °· You have abdominal pain that continues even after rest, or gets worse. °MAKE SURE YOU:  °· Understand these instructions. °· Will watch your condition. °· Will get help right away if you are not doing well or get worse. °    °This information is not intended to replace advice given to you by your health care provider. Make sure you discuss any questions you have with your health care provider. °  °Document Released: 12/12/2005 Document Revised: 10/02/2013 Document Reviewed: 07/11/2013 °Elsevier Interactive Patient Education ©2016 [employer] ° ° ° °Bacterial Vaginosis °Bacterial vaginosis is a vaginal infection that occurs when the normal balance of bacteria in the vagina is disrupted. It results from an overgrowth of certain bacteria. This is the most common vaginal infection in women of childbearing age. Treatment is important to prevent complications, especially in pregnant women, as it can cause a premature delivery. °CAUSES  °Bacterial vaginosis is caused by an increase in harmful bacteria that are normally present in smaller amounts in the vagina. Several different kinds of bacteria can cause bacterial vaginosis. However, the reason that the condition develops is not fully understood. °RISK FACTORS °Certain activities or behaviors can put you at an increased risk of developing bacterial vaginosis, including: °· Having a new sex partner or multiple sex partners. °· Douching. °· Using an intrauterine device (IUD) for contraception. °Women do not get bacterial vaginosis from toilet seats, bedding, swimming pools, or contact with objects around them. °SIGNS AND SYMPTOMS  °Some women with bacterial vaginosis have no signs or symptoms. Common symptoms include: °· Grey vaginal discharge. °· A fishlike odor with discharge, especially after sexual intercourse. °· Itching or burning of the vagina and vulva. °· Burning or pain with urination. °DIAGNOSIS  °Your health care provider will take a medical history and examine the vagina for signs   of bacterial vaginosis. A sample of vaginal fluid may be taken. Your health care provider will look at this sample under a microscope to check for bacteria and abnormal cells. A vaginal pH test may  also be done.  °TREATMENT  °Bacterial vaginosis may be treated with antibiotic medicines. These may be given in the form of a pill or a vaginal cream. A second round of antibiotics may be prescribed if the condition comes back after treatment. Because bacterial vaginosis increases your risk for sexually transmitted diseases, getting treated can help reduce your risk for chlamydia, gonorrhea, HIV, and herpes. °HOME CARE INSTRUCTIONS  °· Only take over-the-counter or prescription medicines as directed by your health care provider. °· If antibiotic medicine was prescribed, take it as directed. Make sure you finish it even if you start to feel better. °· Tell all sexual partners that you have a vaginal infection. They should see their health care provider and be treated if they have problems, such as a mild rash or itching. °· During treatment, it is important that you follow these instructions: °¨ Avoid sexual activity or use condoms correctly. °¨ Do not douche. °¨ Avoid alcohol as directed by your health care provider. °¨ Avoid breastfeeding as directed by your health care provider. °SEEK MEDICAL CARE IF:  °· Your symptoms are not improving after 3 days of treatment. °· You have increased discharge or pain. °· You have a fever. °MAKE SURE YOU:  °· Understand these instructions. °· Will watch your condition. °· Will get help right away if you are not doing well or get worse. °FOR MORE INFORMATION  °Centers for Disease Control and Prevention, Division of STD Prevention: www.cdc.gov/std °American Sexual Health Association (ASHA): www.ashastd.org  °  °This information is not intended to replace advice given to you by your health care provider. Make sure you discuss any questions you have with your health care provider. °  °Document Released: 12/12/2005 Document Revised: 01/02/2015 Document Reviewed: 07/24/2013 °Elsevier Interactive Patient Education ©2016 [employer] Inc. ° °

## 2016-06-20 NOTE — ED Notes (Signed)
Patient given discharge instruction, verbalized understand. Patient ambulatory out of the department.  

## 2016-06-20 NOTE — ED Notes (Signed)
Lab at the bedside 

## 2016-06-20 NOTE — ED Notes (Signed)
Patient ambulatory to restroom with steady gait, clean catch instructions given and advised pt to bring specimen back to room as well.  

## 2016-06-20 NOTE — ED Provider Notes (Signed)
CSN: [id number]     Arrival date & time 06/20/16  0920 History   First MD Initiated Contact with Patient 06/20/16 [phone]     Chief Complaint  Patient presents with  . Abdominal Pain     (Consider location/radiation/quality/duration/timing/severity/associated sxs/prior Treatment) HPI Comments: Patient presents emergency department with chief complaint of pelvic pain. She reports taking a urine pregnancy test a few days ago, came back positive. Her last menstrual period was 2 months ago. She denies any vaginal bleeding or discharge. She denies any nausea, vomiting, or diarrhea. She only complains of crampy lower abdominal pain, which she says is worse at night when she is lying down. She denies any other associated symptoms. Denies any dysuria.  Additionally, patient states that she has been noncompliant in taking her labetalol for hypertension. She states that she does not like the way that it makes her feel.  The history is provided by the patient. No language interpreter was used.    Past Medical History  Diagnosis Date  . Hypertension   . Depression    History reviewed. No pertinent past surgical history. History reviewed. No pertinent family history. Social History  Substance Use Topics  . Smoking status: Current Some Day Smoker -- 0.50 packs/day  . Smokeless tobacco: None  . Alcohol Use: No   OB History    Gravida Para Term Preterm AB TAB SAB Ectopic Multiple Living   5 1   3 2 1   1      Review of Systems  Genitourinary: Positive for pelvic pain.  All other systems reviewed and are negative.     Allergies  Review of patient's allergies indicates no known allergies.  Home Medications   Prior to Admission medications   Medication Sig Start Date End Date Taking? Authorizing Provider  albuterol (PROVENTIL HFA;VENTOLIN HFA) 108 (90 Base) MCG/ACT inhaler Inhale 1-2 puffs into the lungs every 6 (six) hours as needed for wheezing or shortness of breath.    Historical  Provider, MD  labetalol (NORMODYNE) 100 MG tablet Take 1 tablet (100 mg total) by mouth 2 (two) times daily. 03/03/16   Armando ReichertHeather D Hogan, CNM  Prenatal Vit-Min-FA-Fish Oil ([facility] PRENATAL GUMMY) 0.4-113.5 MG CHEW Chew 1 each by mouth daily.    Historical Provider, MD   BP 192/106 mmHg  Temp(Src) 98.5 F (36.9 C) (Oral)  Resp 16  Ht 5\' 5"  (1.651 m)  Wt 68.04 kg  BMI 24.96 kg/m2  SpO2 100%  LMP 04/17/2016 Physical Exam  Constitutional: She is oriented to person, place, and time. She appears well-developed and well-nourished.  HENT:  Head: Normocephalic and atraumatic.  Eyes: Conjunctivae and EOM are normal. Pupils are equal, round, and reactive to light.  Neck: Normal range of motion. Neck supple.  Cardiovascular: Normal rate and regular rhythm.  Exam reveals no gallop and no friction rub.   No murmur heard. Pulmonary/Chest: Effort normal and breath sounds normal. No respiratory distress. She has no wheezes. She has no rales. She exhibits no tenderness.  Abdominal: Soft. Bowel sounds are normal. She exhibits no distension and no mass. There is no tenderness. There is no rebound and no guarding.  Genitourinary:  Pelvic exam chaperoned by female ER tech, no right or left adnexal tenderness, no uterine tenderness, mild white vaginal discharge, no bleeding, no CMT or friability, no foreign body, no injury to the external genitalia, no other significant findings   Musculoskeletal: Normal range of motion. She exhibits no edema or tenderness.  Neurological: She is alert  and oriented to person, place, and time.  Skin: Skin is warm and dry.  Psychiatric: She has a normal mood and affect. Her behavior is normal. Judgment and thought content normal.  Nursing note and vitals reviewed.   ED Course  Procedures (including critical care time) Results for orders placed or performed during the hospital encounter of 06/20/16  Wet prep, genital  Result Value Ref Range   Yeast Wet Prep HPF POC NONE SEEN  NONE SEEN   Trich, Wet Prep NONE SEEN NONE SEEN   Clue Cells Wet Prep HPF POC PRESENT (A) NONE SEEN   WBC, Wet Prep HPF POC MODERATE (A) NONE SEEN   Sperm NONE SEEN   Pregnancy, urine  Result Value Ref Range   Preg Test, Ur POSITIVE (A) NEGATIVE  Urinalysis, Routine w reflex microscopic (not at [facility])  Result Value Ref Range   Color, Urine YELLOW YELLOW   APPearance CLEAR CLEAR   Specific Gravity, Urine 1.015 1.005 - 1.030   pH 7.0 5.0 - 8.0   Glucose, UA NEGATIVE NEGATIVE mg/dL   Hgb urine dipstick NEGATIVE NEGATIVE   Bilirubin Urine NEGATIVE NEGATIVE   Ketones, ur NEGATIVE NEGATIVE mg/dL   Protein, ur NEGATIVE NEGATIVE mg/dL   Nitrite NEGATIVE NEGATIVE   Leukocytes, UA NEGATIVE NEGATIVE  CBC with Differential/Platelet  Result Value Ref Range   WBC 4.2 4.0 - 10.5 K/uL   RBC 4.55 3.87 - 5.11 MIL/uL   Hemoglobin 13.7 12.0 - 15.0 g/dL   HCT 69.6 29.5 - 28.4 %   MCV 87.0 78.0 - 100.0 fL   MCH 30.1 26.0 - 34.0 pg   MCHC 34.6 30.0 - 36.0 g/dL   RDW 13.2 44.0 - 10.2 %   Platelets 230 150 - 400 K/uL   Neutrophils Relative % 55 %   Neutro Abs 2.3 1.7 - 7.7 K/uL   Lymphocytes Relative 28 %   Lymphs Abs 1.2 0.7 - 4.0 K/uL   Monocytes Relative 13 %   Monocytes Absolute 0.5 0.1 - 1.0 K/uL   Eosinophils Relative 4 %   Eosinophils Absolute 0.2 0.0 - 0.7 K/uL   Basophils Relative 0 %   Basophils Absolute 0.0 0.0 - 0.1 K/uL  Comprehensive metabolic panel  Result Value Ref Range   Sodium 135 135 - 145 mmol/L   Potassium 3.5 3.5 - 5.1 mmol/L   Chloride 106 101 - 111 mmol/L   CO2 24 22 - 32 mmol/L   Glucose, Bld 91 65 - 99 mg/dL   BUN 8 6 - 20 mg/dL   Creatinine, Ser 7.25 0.44 - 1.00 mg/dL   Calcium 8.7 (L) 8.9 - 10.3 mg/dL   Total Protein 6.6 6.5 - 8.1 g/dL   Albumin 4.1 3.5 - 5.0 g/dL   AST 15 15 - 41 U/L   ALT 17 14 - 54 U/L   Alkaline Phosphatase 43 38 - 126 U/L   Total Bilirubin 0.9 0.3 - 1.2 mg/dL   GFR calc non Af Amer >60 >60 mL/min   GFR calc Af Amer >60 >60 mL/min    Anion gap 5 5 - 15  hCG, quantitative, pregnancy  Result Value Ref Range   hCG, Beta Chain, Quant, S 12666 (H) <5 mIU/mL   US Ob Comp Less 14 Wks  06/20/2016  CLINICAL DATA:  Right pelvic pain. Early pregnancy. Beta HCG level 12,666. EXAM: OBSTETRIC <14 WK [place] AND TRANSVAGINAL OB US TECHNIQUE: Both transabdominal and transvaginal ultrasound examinations were performed for complete evaluation of the gestation  as well as the maternal uterus, adnexal regions, and pelvic cul-de-sac. Transvaginal technique was performed to assess early pregnancy. COMPARISON:  02/11/2016 FINDINGS: Intrauterine gestational sac: Present Yolk sac:  Present Embryo:  Present Cardiac Activity: Visually suggested that difficult to measure related to early stage of development. Heart Rate: Could not be accurately measured. MSD:   mm    w     d CRL:  3  mm   5 w   6 d                  US EDC: 02/14/2017 Subchorionic hemorrhage:  None visualized. Maternal uterus/adnexae: Ovaries unremarkable, right ovary 11.8 cubic cm and left ovary 13.0 cubic cm. Trace amount of fluid in the posterior cul-de-sac. Possible left anterior fibroid 1 cm diameter. IMPRESSION: 1. Single living intrauterine pregnancy measuring at 5 weeks 6 days gestation. Heart rate could not be accurately measured primarily due to small embryo size. No subchorionic hemorrhage. Trace amount of free pelvic fluid in the cul-de-sac. 2. Possible left anterior uterine fibroid, 1 cm diameter. Electronically Signed   By: Gaylyn RongWalter  Liebkemann M.D.   On: 06/20/2016 13:04   [place] Ob Transvaginal  06/20/2016  CLINICAL DATA:  Right pelvic pain. Early pregnancy. Beta HCG level 12,666. EXAM: OBSTETRIC <14 WK US AND TRANSVAGINAL OB US TECHNIQUE: Both transabdominal and transvaginal ultrasound examinations were performed for complete evaluation of the gestation as well as the maternal uterus, adnexal regions, and pelvic cul-de-sac. Transvaginal technique was performed to assess early pregnancy.  COMPARISON:  02/11/2016 FINDINGS: Intrauterine gestational sac: Present Yolk sac:  Present Embryo:  Present Cardiac Activity: Visually suggested that difficult to measure related to early stage of development. Heart Rate: Could not be accurately measured. MSD:   mm    w     d CRL:  3  mm   5 w   6 d                  US EDC: 02/14/2017 Subchorionic hemorrhage:  None visualized. Maternal uterus/adnexae: Ovaries unremarkable, right ovary 11.8 cubic cm and left ovary 13.0 cubic cm. Trace amount of fluid in the posterior cul-de-sac. Possible left anterior fibroid 1 cm diameter. IMPRESSION: 1. Single living intrauterine pregnancy measuring at 5 weeks 6 days gestation. Heart rate could not be accurately measured primarily due to small embryo size. No subchorionic hemorrhage. Trace amount of free pelvic fluid in the cul-de-sac. 2. Possible left anterior uterine fibroid, 1 cm diameter. Electronically Signed   By: Gaylyn RongWalter  Liebkemann M.D.   On: 06/20/2016 13:04    I have personally reviewed and evaluated these images and lab results as part of my medical decision-making.    MDM   Final diagnoses:  Pregnancy  Lower abdominal pain  Bacterial vaginosis  Patient with lower abdominal pain and recent positive pregnancy test. Check pelvic exam, will reassess.  Additionally, patient states that she has been noncompliant in her blood pressure medication. I will give her dose here, and refill this. Recommend primary care follow-up better blood pressure control.  Pelvic exam is unremarkable for pain, blood, or discharge.  Clue cells are present on wet prep, will treat with metronidazole cream.  Patient is pregnancy test positive. She has a normal IUP on pelvic ultrasound. Recommend close OB/GYN care in follow-up.      Roxy HorsemanRobert Zhyon Antenucci, PA-C 06/20/16 1356  Marily MemosJason Mesner, MD 06/21/16 [phone]

## 2016-06-21 LAB — GC/CHLAMYDIA PROBE AMP (~~LOC~~) NOT AT ARMC
Chlamydia: NEGATIVE
NEISSERIA GONORRHEA: NEGATIVE

## 2016-07-07 ENCOUNTER — Ambulatory Visit (INDEPENDENT_AMBULATORY_CARE_PROVIDER_SITE_OTHER): Payer: Medicaid Other | Admitting: Advanced Practice Midwife

## 2016-07-07 ENCOUNTER — Encounter: Payer: Self-pay | Admitting: Advanced Practice Midwife

## 2016-07-07 ENCOUNTER — Other Ambulatory Visit (HOSPITAL_COMMUNITY)
Admission: RE | Admit: 2016-07-07 | Discharge: 2016-07-07 | Disposition: A | Discharge status: Home / Self Care | Payer: Medicaid Other | Source: Ambulatory Visit | Attending: Advanced Practice Midwife | Admitting: Advanced Practice Midwife

## 2016-07-07 VITALS — BP 140/100 | HR 76 | Wt 163.0 lb

## 2016-07-07 DIAGNOSIS — Z8632 Personal history of gestational diabetes: Secondary | ICD-10-CM

## 2016-07-07 DIAGNOSIS — Z331 Pregnant state, incidental: Secondary | ICD-10-CM | POA: Diagnosis not present

## 2016-07-07 DIAGNOSIS — Z01419 Encounter for gynecological examination (general) (routine) without abnormal findings: Secondary | ICD-10-CM | POA: Insufficient documentation

## 2016-07-07 DIAGNOSIS — O99511 Diseases of the respiratory system complicating pregnancy, first trimester: Secondary | ICD-10-CM

## 2016-07-07 DIAGNOSIS — O99341 Other mental disorders complicating pregnancy, first trimester: Secondary | ICD-10-CM | POA: Diagnosis not present

## 2016-07-07 DIAGNOSIS — O10011 Pre-existing essential hypertension complicating pregnancy, first trimester: Secondary | ICD-10-CM | POA: Diagnosis not present

## 2016-07-07 DIAGNOSIS — O09891 Supervision of other high risk pregnancies, first trimester: Secondary | ICD-10-CM | POA: Diagnosis not present

## 2016-07-07 DIAGNOSIS — F32A Depression, unspecified: Secondary | ICD-10-CM | POA: Insufficient documentation

## 2016-07-07 DIAGNOSIS — O09291 Supervision of pregnancy with other poor reproductive or obstetric history, first trimester: Secondary | ICD-10-CM | POA: Diagnosis not present

## 2016-07-07 DIAGNOSIS — Z0283 Encounter for blood-alcohol and blood-drug test: Secondary | ICD-10-CM

## 2016-07-07 DIAGNOSIS — Z369 Encounter for antenatal screening, unspecified: Secondary | ICD-10-CM

## 2016-07-07 DIAGNOSIS — Z1389 Encounter for screening for other disorder: Secondary | ICD-10-CM | POA: Diagnosis not present

## 2016-07-07 DIAGNOSIS — Z113 Encounter for screening for infections with a predominantly sexual mode of transmission: Secondary | ICD-10-CM | POA: Diagnosis present

## 2016-07-07 DIAGNOSIS — J45909 Unspecified asthma, uncomplicated: Secondary | ICD-10-CM | POA: Insufficient documentation

## 2016-07-07 DIAGNOSIS — O0990 Supervision of high risk pregnancy, unspecified, unspecified trimester: Secondary | ICD-10-CM | POA: Insufficient documentation

## 2016-07-07 DIAGNOSIS — J4520 Mild intermittent asthma, uncomplicated: Secondary | ICD-10-CM

## 2016-07-07 DIAGNOSIS — Z3A09 9 weeks gestation of pregnancy: Secondary | ICD-10-CM | POA: Diagnosis not present

## 2016-07-07 DIAGNOSIS — F329 Major depressive disorder, single episode, unspecified: Secondary | ICD-10-CM | POA: Insufficient documentation

## 2016-07-07 DIAGNOSIS — Z3682 Encounter for antenatal screening for nuchal translucency: Secondary | ICD-10-CM

## 2016-07-07 DIAGNOSIS — I10 Essential (primary) hypertension: Secondary | ICD-10-CM

## 2016-07-07 DIAGNOSIS — O10919 Unspecified pre-existing hypertension complicating pregnancy, unspecified trimester: Secondary | ICD-10-CM | POA: Insufficient documentation

## 2016-07-07 LAB — POCT URINALYSIS DIPSTICK
GLUCOSE UA: NEGATIVE
Ketones, UA: NEGATIVE
Leukocytes, UA: NEGATIVE
Nitrite, UA: NEGATIVE
RBC UA: NEGATIVE

## 2016-07-07 NOTE — Progress Notes (Signed)
  Subjective:    Deborah Klein is a [id number] being seen today for her first obstetrical visit.  Her obstetrical history is significant for CHTN--has been non compliant w/meds, in denial.   Pregnancy history fully reviewed. Term SVD age 25.  2 TAB and 1 early SAB.  Was A1DM.  Hx depression, took meds once (brother died when she was 89) but didn't like how it made her feel.  Interested in therapy.    Patient reports no complaints.  Filed Vitals:   07/07/16 0849  BP: 140/100  Pulse: 76  Weight: 163 lb (73.936 kg)    HISTORY: OB History  Gravida Para Term Preterm AB SAB TAB Ectopic Multiple Living  5 1 1  3 1 2   1     # Outcome Date GA Lbr Len/2nd Weight Sex Delivery Anes PTL Lv  5 Current           4 Term 07/15/08 6455w0d  8 lb 6.4 oz (3.81 kg) M Vag-Spont   Y  3 SAB           2 TAB           1 TAB              Past Medical History  Diagnosis Date  . Asthma   . Depression   . Hypertension    History reviewed. No pertinent past surgical history. Family History  Problem Relation Age of Onset  . Depression Mother   . Depression Sister   . Other Brother     killed at age 25  . Hypertension Maternal Grandmother   . Hypertension Maternal Grandfather      Exam       Pelvic Exam:    Perineum: Normal Perineum   Vulva: normal   Vagina:  normal mucosa, normal discharge, no palpable nodules   Uterus Normal, Gravid, FH: 8     Cervix: normal   Adnexa: Not palpable   Urinary:  urethral meatus normal    System:     Skin: normal coloration and turgor, no rashes    Neurologic: oriented, normal, normal mood   Extremities: normal strength, tone, and muscle mass   HEENT PERRLA   Mouth/Teeth mucous membranes moist, normal dentition   Neck supple and no masses   Cardiovascular: regular rate and rhythm   Respiratory:  appears well, vitals normal, no respiratory distress, acyanotic   Abdomen: soft, non-tender;  FHR: 160US          Assessment:    Pregnancy:  [id number] Patient Active Problem List   Diagnosis Date Noted  . Supervision of other high-risk pregnancy 07/07/2016  . Asthma   . Depression   . Hypertension         Plan:     Initial labs drawn. Add 1 hour gtt d/t hx A1DM Continue prenatal vitamins  Problem list reviewed and updated  Reviewed n/v relief measures and warning s/s to report  Reviewed recommended weight gain based on pre-gravid BMI  Start ASA 81mg  >12 weeks.   Wants to go to [facility]--pt encouraged Encouraged well-balanced diet Genetic Screening discussed Integrated Screen: requested.  Ultrasound discussed; fetal survey: requested.  Return in about 4 weeks (around 08/04/2016) for HROB, US:NT+1st IT.  CRESENZO-DISHMAN,Benz Vandenberghe 07/07/2016

## 2016-07-07 NOTE — Patient Instructions (Signed)
 First Trimester of Pregnancy The first trimester of pregnancy is from week 1 until the end of week 12 (months 1 through 3). A week after a sperm fertilizes an egg, the egg will implant on the wall of the uterus. This embryo will begin to develop into a baby. Genes from you and your partner are forming the baby. The female genes determine whether the baby is a boy or a girl. At 6-8 weeks, the eyes and face are formed, and the heartbeat can be seen on ultrasound. At the end of 12 weeks, all the baby's organs are formed.  Now that you are pregnant, you will want to do everything you can to have a healthy baby. Two of the most important things are to get good prenatal care and to follow your health care provider's instructions. Prenatal care is all the medical care you receive before the baby's birth. This care will help prevent, find, and treat any problems during the pregnancy and childbirth. BODY CHANGES Your body goes through many changes during pregnancy. The changes vary from woman to woman.   You may gain or lose a couple of pounds at first.  You may feel sick to your stomach (nauseous) and throw up (vomit). If the vomiting is uncontrollable, call your health care provider.  You may tire easily.  You may develop headaches that can be relieved by medicines approved by your health care provider.  You may urinate more often. Painful urination may mean you have a bladder infection.  You may develop heartburn as a result of your pregnancy.  You may develop constipation because certain hormones are causing the muscles that push waste through your intestines to slow down.  You may develop hemorrhoids or swollen, bulging veins (varicose veins).  Your breasts may begin to grow larger and become tender. Your nipples may stick out more, and the tissue that surrounds them (areola) may become darker.  Your gums may bleed and may be sensitive to brushing and flossing.  Dark spots or blotches  (chloasma, mask of pregnancy) may develop on your face. This will likely fade after the baby is born.  Your menstrual periods will stop.  You may have a loss of appetite.  You may develop cravings for certain kinds of food.  You may have changes in your emotions from day to day, such as being excited to be pregnant or being concerned that something may go wrong with the pregnancy and baby.  You may have more vivid and strange dreams.  You may have changes in your hair. These can include thickening of your hair, rapid growth, and changes in texture. Some women also have hair loss during or after pregnancy, or hair that feels dry or thin. Your hair will most likely return to normal after your baby is born. WHAT TO EXPECT AT YOUR PRENATAL VISITS During a routine prenatal visit:  You will be weighed to make sure you and the baby are growing normally.  Your blood pressure will be taken.  Your abdomen will be measured to track your baby's growth.  The fetal heartbeat will be listened to starting around week 10 or 12 of your pregnancy.  Test results from any previous visits will be discussed. Your health care provider may ask you:  How you are feeling.  If you are feeling the baby move.  If you have had any abnormal symptoms, such as leaking fluid, bleeding, severe headaches, or abdominal cramping.  If you have any questions. Other   tests that may be performed during your first trimester include:  Blood tests to find your blood type and to check for the presence of any previous infections. They will also be used to check for low iron levels (anemia) and Rh antibodies. Later in the pregnancy, blood tests for diabetes will be done along with other tests if problems develop.  Urine tests to check for infections, diabetes, or protein in the urine.  An ultrasound to confirm the proper growth and development of the baby.  An amniocentesis to check for possible genetic problems.  Fetal  screens for spina bifida and Down syndrome.  You may need other tests to make sure you and the baby are doing well. HOME CARE INSTRUCTIONS  Medicines  Follow your health care provider's instructions regarding medicine use. Specific medicines may be either safe or unsafe to take during pregnancy.  Take your prenatal vitamins as directed.  If you develop constipation, try taking a stool softener if your health care provider approves. Diet  Eat regular, well-balanced meals. Choose a variety of foods, such as meat or vegetable-based protein, fish, milk and low-fat dairy products, vegetables, fruits, and whole grain breads and cereals. Your health care provider will help you determine the amount of weight gain that is right for you.  Avoid raw meat and uncooked cheese. These carry germs that can cause birth defects in the baby.  Eating four or five small meals rather than three large meals a day may help relieve nausea and vomiting. If you start to feel nauseous, eating a few soda crackers can be helpful. Drinking liquids between meals instead of during meals also seems to help nausea and vomiting.  If you develop constipation, eat more high-fiber foods, such as fresh vegetables or fruit and whole grains. Drink enough fluids to keep your urine clear or pale yellow. Activity and Exercise  Exercise only as directed by your health care provider. Exercising will help you:  Control your weight.  Stay in shape.  Be prepared for labor and delivery.  Experiencing pain or cramping in the lower abdomen or low back is a good sign that you should stop exercising. Check with your health care provider before continuing normal exercises.  Try to avoid standing for long periods of time. Move your legs often if you must stand in one place for a long time.  Avoid heavy lifting.  Wear low-heeled shoes, and practice good posture.  You may continue to have sex unless your health care provider directs you  otherwise. Relief of Pain or Discomfort  Wear a good support bra for breast tenderness.   Take warm sitz baths to soothe any pain or discomfort caused by hemorrhoids. Use hemorrhoid cream if your health care provider approves.   Rest with your legs elevated if you have leg cramps or low back pain.  If you develop varicose veins in your legs, wear support hose. Elevate your feet for 15 minutes, 3-4 times a day. Limit salt in your diet. Prenatal Care  Schedule your prenatal visits by the twelfth week of pregnancy. They are usually scheduled monthly at first, then more often in the last 2 months before delivery.  Write down your questions. Take them to your prenatal visits.  Keep all your prenatal visits as directed by your health care provider. Safety  Wear your seat belt at all times when driving.  Make a list of emergency phone numbers, including numbers for family, friends, the hospital, and police and fire departments. General   Tips  Ask your health care provider for a referral to a local prenatal education class. Begin classes no later than at the beginning of month 6 of your pregnancy.  Ask for help if you have counseling or nutritional needs during pregnancy. Your health care provider can offer advice or refer you to specialists for help with various needs.  Do not use hot tubs, steam rooms, or saunas.  Do not douche or use tampons or scented sanitary pads.  Do not cross your legs for long periods of time.  Avoid cat litter boxes and soil used by cats. These carry germs that can cause birth defects in the baby and possibly loss of the fetus by miscarriage or stillbirth.  Avoid all smoking, herbs, alcohol, and medicines not prescribed by your health care provider. Chemicals in these affect the formation and growth of the baby.  Schedule a dentist appointment. At home, brush your teeth with a soft toothbrush and be gentle when you floss. SEEK MEDICAL CARE IF:   You have  dizziness.  You have mild pelvic cramps, pelvic pressure, or nagging pain in the abdominal area.  You have persistent nausea, vomiting, or diarrhea.  You have a bad smelling vaginal discharge.  You have pain with urination.  You notice increased swelling in your face, hands, legs, or ankles. SEEK IMMEDIATE MEDICAL CARE IF:   You have a fever.  You are leaking fluid from your vagina.  You have spotting or bleeding from your vagina.  You have severe abdominal cramping or pain.  You have rapid weight gain or loss.  You vomit blood or material that looks like coffee grounds.  You are exposed to German measles and have never had them.  You are exposed to fifth disease or chickenpox.  You develop a severe headache.  You have shortness of breath.  You have any kind of trauma, such as from a fall or a car accident. Document Released: 12/06/2001 Document Revised: 04/28/2014 Document Reviewed: 10/22/2013 [employer] Patient Information 2015 [employer]. This information is not intended to replace advice given to you by your health care provider. Make sure you discuss any questions you have with your health care provider.   Nausea & Vomiting  Have saltine crackers or pretzels by your bed and eat a few bites before you raise your head out of bed in the morning  Eat small frequent meals throughout the day instead of large meals  Drink plenty of fluids throughout the day to stay hydrated, just don't drink a lot of fluids with your meals.  This can make your stomach fill up faster making you feel sick  Do not brush your teeth right after you eat  Products with real ginger are good for nausea, like ginger ale and ginger hard candy Make sure it says made with real ginger!  Sucking on sour candy like lemon heads is also good for nausea  If your prenatal vitamins make you nauseated, take them at night so you will sleep through the nausea  Sea Bands  If you feel like you need  medicine for the nausea & vomiting please let us know  If you are unable to keep any fluids or food down please let us know   Constipation  Drink plenty of fluid, preferably water, throughout the day  Eat foods high in fiber such as fruits, vegetables, and grains  Exercise, such as walking, is a good way to keep your bowels regular  Drink warm fluids, especially warm   prune juice, or decaf coffee  Eat a 1/2 cup of real oatmeal (not instant), 1/2 cup applesauce, and 1/2-1 cup warm prune juice every day  If needed, you may take Colace (docusate sodium) stool softener once or twice a day to help keep the stool soft. If you are pregnant, wait until you are out of your first trimester (12-14 weeks of pregnancy)  If you still are having problems with constipation, you may take Miralax once daily as needed to help keep your bowels regular.  If you are pregnant, wait until you are out of your first trimester (12-14 weeks of pregnancy)  Safe Medications in Pregnancy   Acne: Benzoyl Peroxide Salicylic Acid  Backache/Headache: Tylenol: 2 regular strength every 4 hours OR              2 Extra strength every 6 hours  Colds/Coughs/Allergies: Benadryl (alcohol free) 25 mg every 6 hours as needed Breath right strips Claritin Cepacol throat lozenges Chloraseptic throat spray Cold-Eeze- up to three times per day Cough drops, alcohol free Flonase (by prescription only) Guaifenesin Mucinex Robitussin DM (plain only, alcohol free) Saline nasal spray/drops Sudafed (pseudoephedrine) & Actifed ** use only after [redacted] weeks gestation and if you do not have high blood pressure Tylenol Vicks Vaporub Zinc lozenges Zyrtec   Constipation: Colace Ducolax suppositories Fleet enema Glycerin suppositories Metamucil Milk of magnesia Miralax Senokot Smooth move tea  Diarrhea: Kaopectate Imodium A-D  *NO pepto Bismol  Hemorrhoids: Anusol Anusol HC Preparation  H Tucks  Indigestion: Tums Maalox Mylanta Zantac  Pepcid  Insomnia: Benadryl (alcohol free) 25mg every 6 hours as needed Tylenol PM Unisom, no Gelcaps  Leg Cramps: Tums MagGel  Nausea/Vomiting:  Bonine Dramamine Emetrol Ginger extract Sea bands Meclizine  Nausea medication to take during pregnancy:  Unisom (doxylamine succinate 25 mg tablets) Take one tablet daily at bedtime. If symptoms are not adequately controlled, the dose can be increased to a maximum recommended dose of two tablets daily (1/2 tablet in the morning, 1/2 tablet mid-afternoon and one at bedtime). Vitamin B6 100mg tablets. Take one tablet twice a day (up to 200 mg per day).  Skin Rashes: Aveeno products Benadryl cream or 25mg every 6 hours as needed Calamine Lotion 1% cortisone cream  Yeast infection: Gyne-lotrimin 7 Monistat 7   **If taking multiple medications, please check labels to avoid duplicating the same active ingredients **take medication as directed on the label ** Do not exceed 4000 mg of tylenol in 24 hours **Do not take medications that contain aspirin or ibuprofen      

## 2016-07-08 LAB — GLUCOSE TOLERANCE, 1 HOUR: Glucose, 1Hr PP: 116 mg/dL (ref 65–199)

## 2016-07-08 LAB — CYTOLOGY - PAP

## 2016-07-09 LAB — URINE CULTURE

## 2016-07-14 LAB — CBC
HEMATOCRIT: 39.7 % (ref 34.0–46.6)
HEMOGLOBIN: 13.1 g/dL (ref 11.1–15.9)
MCH: 29.6 pg (ref 26.6–33.0)
MCHC: 33 g/dL (ref 31.5–35.7)
MCV: 90 fL (ref 79–97)
Platelets: 249 10*3/uL (ref 150–379)
RBC: 4.42 x10E6/uL (ref 3.77–5.28)
RDW: 13.2 % (ref 12.3–15.4)
WBC: 6 10*3/uL (ref 3.4–10.8)

## 2016-07-14 LAB — URINALYSIS, ROUTINE W REFLEX MICROSCOPIC
BILIRUBIN UA: NEGATIVE
Glucose, UA: NEGATIVE
KETONES UA: NEGATIVE
LEUKOCYTES UA: NEGATIVE
Nitrite, UA: NEGATIVE
PH UA: 7 (ref 5.0–7.5)
PROTEIN UA: NEGATIVE
RBC UA: NEGATIVE
SPEC GRAV UA: 1.021 (ref 1.005–1.030)
UUROB: 1 mg/dL (ref 0.2–1.0)

## 2016-07-14 LAB — PMP SCREEN PROFILE (10S), URINE
Amphetamine Screen, Ur: NEGATIVE ng/mL
Barbiturate Screen, Ur: NEGATIVE ng/mL
Benzodiazepine Screen, Urine: NEGATIVE ng/mL
CREATININE(CRT), U: 188.9 mg/dL (ref 20.0–300.0)
Cannabinoids Ur Ql Scn: POSITIVE ng/mL
Cocaine(Metab.)Screen, Urine: NEGATIVE ng/mL
Methadone Scn, Ur: NEGATIVE ng/mL
OPIATE SCRN UR: NEGATIVE ng/mL
OXYCODONE+OXYMORPHONE UR QL SCN: NEGATIVE ng/mL
PCP SCRN UR: NEGATIVE ng/mL
PH UR, DRUG SCRN: 7.4 (ref 4.5–8.9)
Propoxyphene, Screen: NEGATIVE ng/mL

## 2016-07-14 LAB — VARICELLA ZOSTER ANTIBODY, IGG: VARICELLA: 881 {index} (ref >165)

## 2016-07-14 LAB — RPR: RPR: NONREACTIVE

## 2016-07-14 LAB — SICKLE CELL SCREEN: SICKLE CELL SCREEN: NEGATIVE

## 2016-07-14 LAB — CYSTIC FIBROSIS MUTATION 97: Interpretation: NOT DETECTED

## 2016-07-14 LAB — HIV ANTIBODY (ROUTINE TESTING W REFLEX): HIV Screen 4th Generation wRfx: NONREACTIVE

## 2016-07-14 LAB — RUBELLA SCREEN: Rubella Antibodies, IGG: 2.29 index (ref >0.99)

## 2016-07-14 LAB — ANTIBODY SCREEN: Antibody Screen: NEGATIVE

## 2016-07-14 LAB — HEPATITIS B SURFACE ANTIGEN: Hepatitis B Surface Ag: NEGATIVE

## 2016-07-14 LAB — ABO/RH: RH TYPE: POSITIVE

## 2016-07-25 ENCOUNTER — Telehealth: Payer: Self-pay | Admitting: Obstetrics & Gynecology

## 2016-07-25 NOTE — Telephone Encounter (Addendum)
Pt states taking Labetalol 100 mg bid, feels like this med is causing her to have migraines, sensitivity to light/wind. Pt states took Tylenol last night, did help.   Placed pt on hold and call got disconnected, attempted to reach pt by phone x 1 with no answer.   Recalled pt and informed per Cyril Mourning, NP migraines not a common side effect of Labetalol, an appt was scheduled for evaluation of migraines/sensitivity to light/wind per Cyril Mourning, NP.

## 2016-07-26 ENCOUNTER — Telehealth: Payer: Self-pay | Admitting: Women's Health

## 2016-07-26 NOTE — Telephone Encounter (Signed)
Pt informed is safe to Tylenol for headaches at 11 weeks.

## 2016-07-26 NOTE — Telephone Encounter (Signed)
Needs to know what she can take for a headache/ ob pt

## 2016-07-27 ENCOUNTER — Ambulatory Visit (INDEPENDENT_AMBULATORY_CARE_PROVIDER_SITE_OTHER): Payer: Medicaid Other | Admitting: Advanced Practice Midwife

## 2016-07-27 VITALS — BP 156/102 | HR 82 | Wt 161.0 lb

## 2016-07-27 DIAGNOSIS — R51 Headache: Secondary | ICD-10-CM

## 2016-07-27 DIAGNOSIS — Z1389 Encounter for screening for other disorder: Secondary | ICD-10-CM

## 2016-07-27 DIAGNOSIS — O99511 Diseases of the respiratory system complicating pregnancy, first trimester: Secondary | ICD-10-CM | POA: Diagnosis not present

## 2016-07-27 DIAGNOSIS — O09891 Supervision of other high risk pregnancies, first trimester: Secondary | ICD-10-CM

## 2016-07-27 DIAGNOSIS — O10011 Pre-existing essential hypertension complicating pregnancy, first trimester: Secondary | ICD-10-CM

## 2016-07-27 DIAGNOSIS — Z331 Pregnant state, incidental: Secondary | ICD-10-CM | POA: Diagnosis not present

## 2016-07-27 LAB — POCT URINALYSIS DIPSTICK
Blood, UA: NEGATIVE
GLUCOSE UA: NEGATIVE
KETONES UA: NEGATIVE
LEUKOCYTES UA: NEGATIVE
Nitrite, UA: NEGATIVE

## 2016-07-27 NOTE — Patient Instructions (Signed)
Acetomeophin (generic Tylenol) 650mg  (2 regular) or 1 Extra Strength.    Take 2 labetalol tonight, start taking 2 in the morning and 2 at night (total will be 200mg  twice a day).

## 2016-07-27 NOTE — Progress Notes (Signed)
[place] for HA.  Called yesterday with a HA, given appt for today.  Was told to take Tylenol, but states could only find ES.  Given info on generic tylenonl and proper dosage. Wondering if it was her wisdom tooth givin gher HA, but it has been in for a while and looks fine.  Increase Labetalol to 200mg  BID.

## 2016-08-04 ENCOUNTER — Ambulatory Visit (INDEPENDENT_AMBULATORY_CARE_PROVIDER_SITE_OTHER): Payer: Medicaid Other | Admitting: Obstetrics & Gynecology

## 2016-08-04 ENCOUNTER — Ambulatory Visit (INDEPENDENT_AMBULATORY_CARE_PROVIDER_SITE_OTHER): Payer: Medicaid Other

## 2016-08-04 ENCOUNTER — Encounter: Payer: Self-pay | Admitting: Obstetrics & Gynecology

## 2016-08-04 VITALS — BP 140/90 | HR 76 | Wt 163.0 lb

## 2016-08-04 DIAGNOSIS — Z3A13 13 weeks gestation of pregnancy: Secondary | ICD-10-CM | POA: Diagnosis not present

## 2016-08-04 DIAGNOSIS — Z1389 Encounter for screening for other disorder: Secondary | ICD-10-CM

## 2016-08-04 DIAGNOSIS — Z3492 Encounter for supervision of normal pregnancy, unspecified, second trimester: Secondary | ICD-10-CM

## 2016-08-04 DIAGNOSIS — Z3682 Encounter for antenatal screening for nuchal translucency: Secondary | ICD-10-CM

## 2016-08-04 DIAGNOSIS — O09891 Supervision of other high risk pregnancies, first trimester: Secondary | ICD-10-CM

## 2016-08-04 DIAGNOSIS — Z331 Pregnant state, incidental: Secondary | ICD-10-CM | POA: Diagnosis not present

## 2016-08-04 DIAGNOSIS — Z36 Encounter for antenatal screening of mother: Secondary | ICD-10-CM | POA: Diagnosis not present

## 2016-08-04 DIAGNOSIS — O0992 Supervision of high risk pregnancy, unspecified, second trimester: Secondary | ICD-10-CM

## 2016-08-04 DIAGNOSIS — O10012 Pre-existing essential hypertension complicating pregnancy, second trimester: Secondary | ICD-10-CM

## 2016-08-04 LAB — POCT URINALYSIS DIPSTICK
Blood, UA: NEGATIVE
Glucose, UA: NEGATIVE
KETONES UA: NEGATIVE
LEUKOCYTES UA: NEGATIVE
Nitrite, UA: NEGATIVE
Protein, UA: NEGATIVE

## 2016-08-04 MED ORDER — LABETALOL HCL 200 MG PO TABS: 200.0000 mg | ORAL_TABLET | Freq: Two times a day (BID) | ORAL | 11 refills | Status: DC

## 2016-08-04 NOTE — Progress Notes (Signed)
Fetal Surveillance Testing today:  Sonogram NT normal   High Risk Pregnancy Diagnosis(es):   chronci hypertension on labetalol 200 BID  [id number] 6165w2d Estimated Date of Delivery: 02/14/17  Blood pressure 140/90, pulse 76, weight 163 lb (73.9 kg), last menstrual period 04/17/2016.  Urinalysis: Negative   HPI: The patient is being seen today for ongoing management of chronic hypertension. Today she reports no problems   BP weight and urine results all reviewed and noted. Patient reports good fetal movement, denies any bleeding and no rupture of membranes symptoms or regular contractions.  Fundal Height:  na Fetal Heart rate:  160 Edema:  None  Patient is without complaints other than noted in her HPI. All questions were answered.  All lab and sonogram results have been reviewed. Comments:    Assessment:  1.  Pregnancy at 1565w2d,  Estimated Date of Delivery: 02/14/17 :                          2.  Chronic hypertension                        3.    Medication(s) Plans:  Labetalol 200 BID  Treatment Plan:  Per protocol, 2nd IT next visit  Return in about 4 weeks (around 09/01/2016) for LROB, 2nd IT. for appointment for high risk OB care  Meds ordered this encounter  Medications  . labetalol (NORMODYNE) 200 MG tablet    Sig: Take 1 tablet (200 mg total) by mouth 2 (two) times daily.    Dispense:  60 tablet    Refill:  11     No orders of the defined types were placed in this encounter.  Orders Placed This Encounter  Procedures  . Maternal Screen, Integrated #1  . POCT urinalysis dipstick

## 2016-08-04 NOTE — Progress Notes (Signed)
US 12+2 wks,measurements c/w dates,normal ov's bilat,NT 1.2 mm, crl 61.9 mm,NB present,post pl gr 0

## 2016-08-06 LAB — MATERNAL SCREEN, INTEGRATED #1
Crown Rump Length: 61.9 mm
GEST. AGE ON COLLECTION DATE: 12.6 wk
MATERNAL AGE AT EDD: 25.1 a
NUMBER OF FETUSES: 1
Nuchal Translucency (NT): 1.2 mm
PAPP-A Value: 756.6 ng/mL
PDF: 0
WEIGHT: 163 [lb_av]

## 2016-08-10 ENCOUNTER — Telehealth: Payer: Self-pay | Admitting: Obstetrics & Gynecology

## 2016-08-10 NOTE — Telephone Encounter (Signed)
Pt called stating that she would like a call back from the nurse, Pt did not state the reason why. Please contact pt

## 2016-08-10 NOTE — Telephone Encounter (Signed)
Pt c/o of headaches, can she take another Tylenol extra strength, last taken at 6 am this morning.   Call got loss, lmtrc x 1/cp

## 2016-08-11 NOTE — Telephone Encounter (Signed)
Pt states did take Extra Strength Tylenol last night and does feel better. Pt informed to call our office back as needed.

## 2016-09-01 ENCOUNTER — Ambulatory Visit (INDEPENDENT_AMBULATORY_CARE_PROVIDER_SITE_OTHER): Payer: Medicaid Other | Admitting: Advanced Practice Midwife

## 2016-09-01 ENCOUNTER — Encounter: Payer: Self-pay | Admitting: Advanced Practice Midwife

## 2016-09-01 VITALS — BP 158/100 | HR 76 | Wt 165.0 lb

## 2016-09-01 DIAGNOSIS — Z331 Pregnant state, incidental: Secondary | ICD-10-CM

## 2016-09-01 DIAGNOSIS — I10 Essential (primary) hypertension: Secondary | ICD-10-CM

## 2016-09-01 DIAGNOSIS — Z363 Encounter for antenatal screening for malformations: Secondary | ICD-10-CM

## 2016-09-01 DIAGNOSIS — O10012 Pre-existing essential hypertension complicating pregnancy, second trimester: Secondary | ICD-10-CM

## 2016-09-01 DIAGNOSIS — Z1389 Encounter for screening for other disorder: Secondary | ICD-10-CM

## 2016-09-01 DIAGNOSIS — Z3682 Encounter for antenatal screening for nuchal translucency: Secondary | ICD-10-CM

## 2016-09-01 DIAGNOSIS — O09892 Supervision of other high risk pregnancies, second trimester: Secondary | ICD-10-CM

## 2016-09-01 LAB — POCT URINALYSIS DIPSTICK
Blood, UA: NEGATIVE
Glucose, UA: NEGATIVE
KETONES UA: NEGATIVE
LEUKOCYTES UA: NEGATIVE
Nitrite, UA: NEGATIVE
PROTEIN UA: NEGATIVE

## 2016-09-01 MED ORDER — ASPIRIN EC 81 MG PO TBEC: 81.0000 mg | DELAYED_RELEASE_TABLET | Freq: Every day | ORAL | 6 refills | Status: DC

## 2016-09-01 MED ORDER — BLOOD PRESSURE MONITOR AUTOMAT DEVI: 0 refills | Status: DC

## 2016-09-01 NOTE — Patient Instructions (Signed)

## 2016-09-01 NOTE — Progress Notes (Signed)
Fetal Surveillance Testing today:  doppler   High Risk Pregnancy Diagnosis(es):   CHTN  [id number] 484w2d Estimated Date of Delivery: 02/14/17  Blood pressure (!) 158/100, pulse 76, weight 165 lb (74.8 kg), last menstrual period 04/17/2016.  Urinalysis: Negative   HPI: The patient is being seen today for ongoing management of the above. Today she reports no complaints.  She has not started ASA yet.  Has not taken BP meds yet (usually takes around 10)   BP weight and urine results all reviewed and noted. Patient reports good fetal movement, denies any bleeding and no rupture of membranes symptoms or regular contractions.   Fetal Heart rate:  152 Edema:  no  Patient is without complaints other than noted in her HPI. All questions were answered.  All lab and sonogram results have been reviewed. Comments:  2nd IT today  Assessment:  1.  Pregnancy at 6184w2d,  Estimated Date of Delivery: 02/14/17 :                          2.  CHTN:                          3.    Medication(s) Plans:  Continue Labetalol 200mg  BID; start ASA 81mg   Treatment Plan:  Rx for  BP cuff:  Start home monitoring BP BID.  Monthly growth US  Return in about 3 weeks (around 09/22/2016) for HROB, GN:FAOZHYQS:Anatomy. for appointment for high risk OB care  Meds ordered this encounter  Medications  . aspirin EC 81 MG tablet    Sig: Take 1 tablet (81 mg total) by mouth daily.    Dispense:  30 tablet    Refill:  6    Order Specific Question:   Supervising Provider    Answer:   Duane LopeEURE, LUTHER H [[id number]   Orders Placed This Encounter  Procedures  . US OB Comp + 14 Wk  . Maternal Screen, Integrated #2  . POCT urinalysis dipstick

## 2016-09-03 LAB — MATERNAL SCREEN, INTEGRATED #2
AFP MoM: 0.97
Alpha-Fetoprotein: 33.2 ng/mL
Crown Rump Length: 61.9 mm
DIA MOM: 1.22
DIA VALUE: 199.9 pg/mL
Estriol, Unconjugated: 1.01 ng/mL
Gest. Age on Collection Date: 12.6 weeks
Gestational Age: 16.6 weeks
HCG MOM: 1.82
Maternal Age at EDD: 25.1 years
NUCHAL TRANSLUCENCY (NT): 1.2 mm
NUCHAL TRANSLUCENCY MOM: 0.77
Number of Fetuses: 1
PAPP-A MOM: 0.85
PAPP-A VALUE: 756.6 ng/mL
TEST RESULTS: NEGATIVE
Weight: 163 [lb_av]
Weight: 165 [lb_av]
hCG Value: 53.8 IU/mL
uE3 MoM: 1.13

## 2016-09-06 ENCOUNTER — Ambulatory Visit (INDEPENDENT_AMBULATORY_CARE_PROVIDER_SITE_OTHER): Payer: Medicaid Other | Admitting: Advanced Practice Midwife

## 2016-09-06 VITALS — BP 140/98 | HR 86 | Wt 167.0 lb

## 2016-09-06 DIAGNOSIS — O9989 Other specified diseases and conditions complicating pregnancy, childbirth and the puerperium: Secondary | ICD-10-CM | POA: Diagnosis not present

## 2016-09-06 DIAGNOSIS — I10 Essential (primary) hypertension: Secondary | ICD-10-CM

## 2016-09-06 DIAGNOSIS — Z3A17 17 weeks gestation of pregnancy: Secondary | ICD-10-CM | POA: Diagnosis not present

## 2016-09-06 DIAGNOSIS — R51 Headache: Secondary | ICD-10-CM

## 2016-09-06 DIAGNOSIS — O09892 Supervision of other high risk pregnancies, second trimester: Secondary | ICD-10-CM | POA: Diagnosis not present

## 2016-09-06 DIAGNOSIS — O10912 Unspecified pre-existing hypertension complicating pregnancy, second trimester: Secondary | ICD-10-CM

## 2016-09-06 MED ORDER — BUTALBITAL-APAP-CAFFEINE 50-325-40 MG PO TABS: 1.0000 | ORAL_TABLET | Freq: Four times a day (QID) | ORAL | 1 refills | Status: DC | PRN

## 2016-09-06 NOTE — Progress Notes (Signed)
WORK IN FOR HA, INCREASED BP  Fetal Surveillance Testing today:  doppler   High Risk Pregnancy Diagnosis(es):   CHTN  [id number] 4741w0d Estimated Date of Delivery: 02/14/17  Blood pressure (!) 140/98, pulse 86, weight 167 lb (75.8 kg), last menstrual period 04/17/2016.  Urinalysis: Negative   HPI: WORK IN FOR HA, INCREASED BP.  BP at home 173/118. (before she took her meds)  Has frontal HA, has  taken Tylenol, it did help some, but still has HA    Patient is without complaints other than noted in her HPI. All questions were answered.  All lab and sonogram results have been reviewed. Comments:    Assessment:  1.  Pregnancy at 7841w0d,  Estimated Date of Delivery: 02/14/17 :                          2.  Elevated BP, resolved after taking meds                        3.  HA:  responsive to accupressre  Medication(s) Plans:  continue labetalol 200 mg BID; contine ASA 81 mg. rx Fioricet prn  Treatment Plan:   Growth u/s @ 20, 28, 34, 38wks     2x/wk testing nst/sono @ 32wks    Deliver @  39wks (meds):   Return for As scheduled. for appointment for high risk OB care  Meds ordered this encounter  Medications  . butalbital-acetaminophen-caffeine (FIORICET, ESGIC) 50-325-40 MG tablet    Sig: Take 1-2 tablets by mouth every 6 (six) hours as needed for headache.    Dispense:  30 tablet    Refill:  1    Order Specific Question:   Supervising Provider    Answer:   Despina HiddenEURE, LUTHER H [[id number]   No orders of the defined types were placed in this encounter.

## 2016-09-22 ENCOUNTER — Ambulatory Visit (INDEPENDENT_AMBULATORY_CARE_PROVIDER_SITE_OTHER): Payer: Medicaid Other | Admitting: Advanced Practice Midwife

## 2016-09-22 ENCOUNTER — Ambulatory Visit (INDEPENDENT_AMBULATORY_CARE_PROVIDER_SITE_OTHER): Payer: Medicaid Other

## 2016-09-22 ENCOUNTER — Encounter: Payer: Self-pay | Admitting: Advanced Practice Midwife

## 2016-09-22 VITALS — BP 120/90 | HR 74 | Wt 170.0 lb

## 2016-09-22 DIAGNOSIS — Z23 Encounter for immunization: Secondary | ICD-10-CM | POA: Diagnosis not present

## 2016-09-22 DIAGNOSIS — Z36 Encounter for antenatal screening of mother: Secondary | ICD-10-CM

## 2016-09-22 DIAGNOSIS — Z331 Pregnant state, incidental: Secondary | ICD-10-CM

## 2016-09-22 DIAGNOSIS — O09892 Supervision of other high risk pregnancies, second trimester: Secondary | ICD-10-CM

## 2016-09-22 DIAGNOSIS — Z363 Encounter for antenatal screening for malformations: Secondary | ICD-10-CM

## 2016-09-22 DIAGNOSIS — O9989 Other specified diseases and conditions complicating pregnancy, childbirth and the puerperium: Secondary | ICD-10-CM

## 2016-09-22 DIAGNOSIS — Z1389 Encounter for screening for other disorder: Secondary | ICD-10-CM

## 2016-09-22 DIAGNOSIS — Z3A20 20 weeks gestation of pregnancy: Secondary | ICD-10-CM

## 2016-09-22 DIAGNOSIS — I10 Essential (primary) hypertension: Secondary | ICD-10-CM

## 2016-09-22 LAB — POCT URINALYSIS DIPSTICK
Blood, UA: NEGATIVE
GLUCOSE UA: NEGATIVE
KETONES UA: NEGATIVE
LEUKOCYTES UA: NEGATIVE
Nitrite, UA: NEGATIVE
PROTEIN UA: NEGATIVE

## 2016-09-22 MED ORDER — PNV PRENATAL PLUS MULTIVITAMIN 27-1 MG PO TABS: 1.0000 | ORAL_TABLET | Freq: Every day | ORAL | 11 refills | Status: DC

## 2016-09-22 NOTE — Progress Notes (Signed)
Fetal Surveillance Testing today:  US   High Risk Pregnancy Diagnosis(es):   CHTN  [id number] 3757w2d Estimated Date of Delivery: 02/14/17  Blood pressure 120/90, pulse 74, weight 170 lb (77.1 kg), last menstrual period 04/17/2016.  Urinalysis: Negative   HPI: The patient is being seen today for ongoing management of the above. Today she reports no complaints.  Has HA about 3x/week; fioricet helps. Thinks it may be her wisdom teeth.  Advised to get dental form from up front and go see dentist   BP weight and urine results all reviewed and noted. Patient reports good fetal movement, denies any bleeding and no rupture of membranes symptoms or regular contractions.   Edema:  no  Patient is without complaints other than noted in her HPI. All questions were answered.  All lab and sonogram results have been reviewed. Comments:  US 19+2 wks,cephalic,cx 3.4 cm,normal ov's bilat,post pl gr 0, svp of fluid 6.2 cm,fhr 137 bpm,efw 302 g,anatomy complete no obvious abnormalities seen  Assessment:  1.  Pregnancy at 3957w2d,  Estimated Date of Delivery: 02/14/17 :                          2.  CHTN, well controlled                        3.    Medication(s) Plans:  ASA 81mg  and Labetalol 200mg  daily  Treatment Plan:   Growth US 28, 34, 38wks     2x/wk testing nst/sono @ 32wks    Deliver @  39wks (meds):   No Follow-up on file. for appointment for high risk OB care  No orders of the defined types were placed in this encounter.  Orders Placed This Encounter  Procedures  . POCT urinalysis dipstick

## 2016-09-22 NOTE — Progress Notes (Signed)
US 19+2 wks,cephalic,cx 3.4 cm,normal ov's bilat,post pl gr 0, svp of fluid 6.2 cm,fhr 137 bpm,efw 302 g,anatomy complete no obvious abnormalities seen

## 2016-10-03 ENCOUNTER — Telehealth: Payer: Self-pay | Admitting: Women's Health

## 2016-10-03 NOTE — Telephone Encounter (Signed)
Pt called stating that she would like for the doctor to write a note for her job stating that she is not allowed to lift things with a certain limit. Pt want a restriction note. Please contact pt

## 2016-10-04 NOTE — Telephone Encounter (Signed)
LM on VM that note is ready to be picked up at the front desk.

## 2016-10-04 NOTE — Telephone Encounter (Signed)
Note left up front 

## 2016-10-20 ENCOUNTER — Encounter: Payer: Self-pay | Admitting: Women's Health

## 2016-10-20 ENCOUNTER — Ambulatory Visit (INDEPENDENT_AMBULATORY_CARE_PROVIDER_SITE_OTHER): Payer: Medicaid Other | Admitting: Women's Health

## 2016-10-20 VITALS — BP 132/90 | HR 76 | Wt 180.0 lb

## 2016-10-20 DIAGNOSIS — O09299 Supervision of pregnancy with other poor reproductive or obstetric history, unspecified trimester: Secondary | ICD-10-CM

## 2016-10-20 DIAGNOSIS — Z1389 Encounter for screening for other disorder: Secondary | ICD-10-CM | POA: Diagnosis not present

## 2016-10-20 DIAGNOSIS — O10012 Pre-existing essential hypertension complicating pregnancy, second trimester: Secondary | ICD-10-CM

## 2016-10-20 DIAGNOSIS — Z331 Pregnant state, incidental: Secondary | ICD-10-CM

## 2016-10-20 DIAGNOSIS — Z3A24 24 weeks gestation of pregnancy: Secondary | ICD-10-CM | POA: Diagnosis not present

## 2016-10-20 DIAGNOSIS — O09292 Supervision of pregnancy with other poor reproductive or obstetric history, second trimester: Secondary | ICD-10-CM

## 2016-10-20 DIAGNOSIS — O10919 Unspecified pre-existing hypertension complicating pregnancy, unspecified trimester: Secondary | ICD-10-CM

## 2016-10-20 DIAGNOSIS — O0990 Supervision of high risk pregnancy, unspecified, unspecified trimester: Secondary | ICD-10-CM

## 2016-10-20 DIAGNOSIS — O0992 Supervision of high risk pregnancy, unspecified, second trimester: Secondary | ICD-10-CM | POA: Diagnosis not present

## 2016-10-20 DIAGNOSIS — Z8632 Personal history of gestational diabetes: Secondary | ICD-10-CM | POA: Insufficient documentation

## 2016-10-20 LAB — POCT URINALYSIS DIPSTICK
Glucose, UA: NEGATIVE
KETONES UA: NEGATIVE
Leukocytes, UA: NEGATIVE
Nitrite, UA: NEGATIVE
PROTEIN UA: NEGATIVE
RBC UA: NEGATIVE

## 2016-10-20 NOTE — Patient Instructions (Signed)
You will have your sugar test next visit.  Please do not eat or drink anything after midnight the night before you come, not even water.  You will be here for at least two hours.     Call the office ([phone]) or go to [facility] if:  You begin to have strong, frequent contractions  Your water breaks.  Sometimes it is a big gush of fluid, sometimes it is just a trickle that keeps getting your panties wet or running down your legs  You have vaginal bleeding.  It is normal to have a small amount of spotting if your cervix was checked.   You don't feel your baby moving like normal.  If you don't, get you something to eat and drink and lay down and focus on feeling your baby move.   If your baby is still not moving like normal, you should call the office or go to [facility].  Second Trimester of Pregnancy The second trimester is from week 13 through week 28, months 4 through 6. The second trimester is often a time when you feel your best. Your body has also adjusted to being pregnant, and you begin to feel better physically. Usually, morning sickness has lessened or quit completely, you may have more energy, and you may have an increase in appetite. The second trimester is also a time when the fetus is growing rapidly. At the end of the sixth month, the fetus is about 9 inches long and weighs about 1 pounds. You will likely begin to feel the baby move (quickening) between 18 and 20 weeks of the pregnancy. BODY CHANGES Your body goes through many changes during pregnancy. The changes vary from woman to woman.   Your weight will continue to increase. You will notice your lower abdomen bulging out.  You may begin to get stretch marks on your hips, abdomen, and breasts.  You may develop headaches that can be relieved by medicines approved by your health care provider.  You may urinate more often because the fetus is pressing on your bladder.  You may develop or continue to have  heartburn as a result of your pregnancy.  You may develop constipation because certain hormones are causing the muscles that push waste through your intestines to slow down.  You may develop hemorrhoids or swollen, bulging veins (varicose veins).  You may have back pain because of the weight gain and pregnancy hormones relaxing your joints between the bones in your pelvis and as a result of a shift in weight and the muscles that support your balance.  Your breasts will continue to grow and be tender.  Your gums may bleed and may be sensitive to brushing and flossing.  Dark spots or blotches (chloasma, mask of pregnancy) may develop on your face. This will likely fade after the baby is born.  A dark line from your belly button to the pubic area (linea nigra) may appear. This will likely fade after the baby is born.  You may have changes in your hair. These can include thickening of your hair, rapid growth, and changes in texture. Some women also have hair loss during or after pregnancy, or hair that feels dry or thin. Your hair will most likely return to normal after your baby is born. WHAT TO EXPECT AT YOUR PRENATAL VISITS During a routine prenatal visit:  You will be weighed to make sure you and the fetus are growing normally.  Your blood pressure will be taken.    Your abdomen will be measured to track your baby's growth.  The fetal heartbeat will be listened to.  Any test results from the previous visit will be discussed. Your health care provider may ask you:  How you are feeling.  If you are feeling the baby move.  If you have had any abnormal symptoms, such as leaking fluid, bleeding, severe headaches, or abdominal cramping.  If you have any questions. Other tests that may be performed during your second trimester include:  Blood tests that check for:  Low iron levels (anemia).  Gestational diabetes (between 24 and 28 weeks).  Rh antibodies.  Urine tests to check  for infections, diabetes, or protein in the urine.  An ultrasound to confirm the proper growth and development of the baby.  An amniocentesis to check for possible genetic problems.  Fetal screens for spina bifida and Down syndrome. HOME CARE INSTRUCTIONS   Avoid all smoking, herbs, alcohol, and unprescribed drugs. These chemicals affect the formation and growth of the baby.  Follow your health care provider's instructions regarding medicine use. There are medicines that are either safe or unsafe to take during pregnancy.  Exercise only as directed by your health care provider. Experiencing uterine cramps is a good sign to stop exercising.  Continue to eat regular, healthy meals.  Wear a good support bra for breast tenderness.  Do not use hot tubs, steam rooms, or saunas.  Wear your seat belt at all times when driving.  Avoid raw meat, uncooked cheese, cat litter boxes, and soil used by cats. These carry germs that can cause birth defects in the baby.  Take your prenatal vitamins.  Try taking a stool softener (if your health care provider approves) if you develop constipation. Eat more high-fiber foods, such as fresh vegetables or fruit and whole grains. Drink plenty of fluids to keep your urine clear or pale yellow.  Take warm sitz baths to soothe any pain or discomfort caused by hemorrhoids. Use hemorrhoid cream if your health care provider approves.  If you develop varicose veins, wear support hose. Elevate your feet for 15 minutes, 3-4 times a day. Limit salt in your diet.  Avoid heavy lifting, wear low heel shoes, and practice good posture.  Rest with your legs elevated if you have leg cramps or low back pain.  Visit your dentist if you have not gone yet during your pregnancy. Use a soft toothbrush to brush your teeth and be gentle when you floss.  A sexual relationship may be continued unless your health care provider directs you otherwise.  Continue to go to all your  prenatal visits as directed by your health care provider. SEEK MEDICAL CARE IF:   You have dizziness.  You have mild pelvic cramps, pelvic pressure, or nagging pain in the abdominal area.  You have persistent nausea, vomiting, or diarrhea.  You have a bad smelling vaginal discharge.  You have pain with urination. SEEK IMMEDIATE MEDICAL CARE IF:   You have a fever.  You are leaking fluid from your vagina.  You have spotting or bleeding from your vagina.  You have severe abdominal cramping or pain.  You have rapid weight gain or loss.  You have shortness of breath with chest pain.  You notice sudden or extreme swelling of your face, hands, ankles, feet, or legs.  You have not felt your baby move in over an hour.  You have severe headaches that do not go away with medicine.  You have vision changes.   Document Released: 12/06/2001 Document Revised: 12/17/2013 Document Reviewed: 02/12/2013 [employer] Patient Information 2015 [employer]. This information is not intended to replace advice given to you by your health care provider. Make sure you discuss any questions you have with your health care provider.     

## 2016-10-20 NOTE — Progress Notes (Signed)
High Risk Pregnancy Diagnosis(es): CHTN [id number] 436w2d Estimated Date of Delivery: 02/14/17 BP 132/90   Pulse 76   Wt 180 lb (81.6 kg)   LMP 04/17/2016   BMI 29.95 kg/m   Urinalysis: Negative HPI:  Doing well BP, weight, and urine reviewed.  Reports good fm. Denies regular uc's, lof, vb, uti s/s. No complaints.  Fundal Height:  24 Fetal Heart rate:  145 Edema:  none  Reviewed ptl s/s, fm All questions were answered Assessment: 4636w2d CHTN Medication(s) Plans:  Continue labetalol 200mg  BID Treatment Plan:  Will get baseline 24hr urine, Growth u/s @ 28, 34, 38wks     2x/wk testing nst/sono @ 32wks    Deliver @ 39wks (meds) Follow up in 4wks for high-risk OB appt, pn2, and efw/afi u/s

## 2016-10-27 LAB — PROTEIN, URINE, 24 HOUR
PROTEIN UR: 7.4 mg/dL
Protein, 24H Urine: 56 mg/24 hr (ref 30–150)

## 2016-11-21 ENCOUNTER — Ambulatory Visit (INDEPENDENT_AMBULATORY_CARE_PROVIDER_SITE_OTHER): Payer: Medicaid Other

## 2016-11-21 ENCOUNTER — Other Ambulatory Visit: Payer: Medicaid Other

## 2016-11-21 ENCOUNTER — Ambulatory Visit (INDEPENDENT_AMBULATORY_CARE_PROVIDER_SITE_OTHER): Payer: Medicaid Other | Admitting: Women's Health

## 2016-11-21 VITALS — BP 148/90 | HR 74 | Wt 189.0 lb

## 2016-11-21 DIAGNOSIS — O10919 Unspecified pre-existing hypertension complicating pregnancy, unspecified trimester: Secondary | ICD-10-CM

## 2016-11-21 DIAGNOSIS — Z3483 Encounter for supervision of other normal pregnancy, third trimester: Secondary | ICD-10-CM

## 2016-11-21 DIAGNOSIS — F1290 Cannabis use, unspecified, uncomplicated: Secondary | ICD-10-CM | POA: Insufficient documentation

## 2016-11-21 DIAGNOSIS — Z331 Pregnant state, incidental: Secondary | ICD-10-CM

## 2016-11-21 DIAGNOSIS — O321XX1 Maternal care for breech presentation, fetus 1: Secondary | ICD-10-CM

## 2016-11-21 DIAGNOSIS — O99323 Drug use complicating pregnancy, third trimester: Secondary | ICD-10-CM | POA: Diagnosis not present

## 2016-11-21 DIAGNOSIS — O10013 Pre-existing essential hypertension complicating pregnancy, third trimester: Secondary | ICD-10-CM | POA: Diagnosis not present

## 2016-11-21 DIAGNOSIS — O0993 Supervision of high risk pregnancy, unspecified, third trimester: Secondary | ICD-10-CM

## 2016-11-21 DIAGNOSIS — Z3A28 28 weeks gestation of pregnancy: Secondary | ICD-10-CM | POA: Diagnosis not present

## 2016-11-21 DIAGNOSIS — O10913 Unspecified pre-existing hypertension complicating pregnancy, third trimester: Secondary | ICD-10-CM | POA: Diagnosis not present

## 2016-11-21 DIAGNOSIS — Z1389 Encounter for screening for other disorder: Secondary | ICD-10-CM

## 2016-11-21 DIAGNOSIS — O0992 Supervision of high risk pregnancy, unspecified, second trimester: Secondary | ICD-10-CM

## 2016-11-21 DIAGNOSIS — O0990 Supervision of high risk pregnancy, unspecified, unspecified trimester: Secondary | ICD-10-CM

## 2016-11-21 DIAGNOSIS — Z131 Encounter for screening for diabetes mellitus: Secondary | ICD-10-CM

## 2016-11-21 LAB — POCT URINALYSIS DIPSTICK
GLUCOSE UA: NEGATIVE
Ketones, UA: NEGATIVE
LEUKOCYTES UA: NEGATIVE
NITRITE UA: NEGATIVE
Protein, UA: NEGATIVE
RBC UA: NEGATIVE

## 2016-11-21 NOTE — Progress Notes (Signed)
High Risk Pregnancy Diagnosis(es): CHTN [id number] 2213w6d Estimated Date of Delivery: 02/14/17 BP (!) 148/90   Pulse 74   Wt 189 lb (85.7 kg)   LMP 04/17/2016   BMI 31.45 kg/m   Urinalysis: Negative HPI:  Doing well, no THC in ~432mths so appetite has decreased BP, weight, and urine reviewed.  Reports good fm. Denies regular uc's, lof, vb, uti s/s. No complaints.  Fundal Height:  28 Fetal Heart rate:  +u/s Edema:  none  Reviewed today's f/u u/s: efw 52%, AFI normal All questions were answered Assessment: 3613w6d CHTN Medication(s) Plans:  Continue labetalol 200mg  BID-bp >140/90 on meds, not severe-range and don't want to drop too low- so will not increase meds at this time, baby asa daily Treatment Plan: Growth u/s @ 32, 35, 38wks    BPP 2x/wk @ 28wks     2x/wk testing nst/sono @ 32wks     Deliver PRN or 37wks Follow up in 1wk for bpp and HROB   PN2 today

## 2016-11-21 NOTE — Progress Notes (Signed)
US 27+6 wks,breech,cx 4.1 cm,normal ov's bilat,post pl gr 0,afi 16.5 cm,fhr 145 bpm,efw 1142 g 52%

## 2016-11-21 NOTE — Patient Instructions (Signed)

## 2016-11-22 LAB — PMP SCREEN PROFILE (10S), URINE
Amphetamine Screen, Ur: NEGATIVE ng/mL
BENZODIAZEPINE SCREEN, URINE: NEGATIVE ng/mL
Barbiturate Screen, Ur: NEGATIVE ng/mL
CANNABINOIDS UR QL SCN: POSITIVE ng/mL
COCAINE(METAB.) SCREEN, URINE: NEGATIVE ng/mL
Creatinine(Crt), U: 85.3 mg/dL (ref 20.0–300.0)
Methadone Scn, Ur: NEGATIVE ng/mL
Opiate Scrn, Ur: NEGATIVE ng/mL
Oxycodone+Oxymorphone Ur Ql Scn: NEGATIVE ng/mL
PCP SCRN UR: NEGATIVE ng/mL
Ph of Urine: 8.3 (ref 4.5–8.9)
Propoxyphene, Screen: NEGATIVE ng/mL

## 2016-11-22 LAB — GLUCOSE TOLERANCE, 2 HOURS W/ 1HR
GLUCOSE, 1 HOUR: 131 mg/dL (ref 65–179)
GLUCOSE, FASTING: 74 mg/dL (ref 65–91)
Glucose, 2 hour: 61 mg/dL — ABNORMAL LOW (ref 65–152)

## 2016-11-22 LAB — CBC
HEMATOCRIT: 35.7 % (ref 34.0–46.6)
Hemoglobin: 12.5 g/dL (ref 11.1–15.9)
MCH: 31.3 pg (ref 26.6–33.0)
MCHC: 35 g/dL (ref 31.5–35.7)
MCV: 90 fL (ref 79–97)
PLATELETS: 242 10*3/uL (ref 150–379)
RBC: 3.99 x10E6/uL (ref 3.77–5.28)
RDW: 13.7 % (ref 12.3–15.4)
WBC: 9.3 10*3/uL (ref 3.4–10.8)

## 2016-11-22 LAB — ANTIBODY SCREEN: ANTIBODY SCREEN: NEGATIVE

## 2016-11-22 LAB — RPR: RPR Ser Ql: NONREACTIVE

## 2016-11-22 LAB — HIV ANTIBODY (ROUTINE TESTING W REFLEX): HIV SCREEN 4TH GENERATION: NONREACTIVE

## 2016-11-28 ENCOUNTER — Ambulatory Visit (INDEPENDENT_AMBULATORY_CARE_PROVIDER_SITE_OTHER): Payer: Medicaid Other | Admitting: Obstetrics and Gynecology

## 2016-11-28 ENCOUNTER — Ambulatory Visit (INDEPENDENT_AMBULATORY_CARE_PROVIDER_SITE_OTHER): Payer: Medicaid Other

## 2016-11-28 VITALS — BP 140/90 | HR 91 | Wt 187.8 lb

## 2016-11-28 DIAGNOSIS — Z3A29 29 weeks gestation of pregnancy: Secondary | ICD-10-CM

## 2016-11-28 DIAGNOSIS — O10919 Unspecified pre-existing hypertension complicating pregnancy, unspecified trimester: Secondary | ICD-10-CM

## 2016-11-28 DIAGNOSIS — Z1389 Encounter for screening for other disorder: Secondary | ICD-10-CM

## 2016-11-28 DIAGNOSIS — O10913 Unspecified pre-existing hypertension complicating pregnancy, third trimester: Secondary | ICD-10-CM | POA: Diagnosis not present

## 2016-11-28 DIAGNOSIS — Z331 Pregnant state, incidental: Secondary | ICD-10-CM

## 2016-11-28 DIAGNOSIS — O0993 Supervision of high risk pregnancy, unspecified, third trimester: Secondary | ICD-10-CM | POA: Diagnosis not present

## 2016-11-28 DIAGNOSIS — O0990 Supervision of high risk pregnancy, unspecified, unspecified trimester: Secondary | ICD-10-CM

## 2016-11-28 LAB — POCT URINALYSIS DIPSTICK
GLUCOSE UA: NEGATIVE
Ketones, UA: NEGATIVE
Leukocytes, UA: NEGATIVE
NITRITE UA: NEGATIVE
RBC UA: NEGATIVE

## 2016-11-28 MED ORDER — LABETALOL HCL 200 MG PO TABS: 400.0000 mg | ORAL_TABLET | Freq: Two times a day (BID) | ORAL | 3 refills | Status: DC

## 2016-11-28 NOTE — Progress Notes (Addendum)
US 28+6 wks,cephalic,fhr 148 bpm,cx 3.4 cm,post pl gr 0,normal ov's bilat,afi 18 cm,BPP 8/8

## 2016-11-28 NOTE — Progress Notes (Signed)
High Risk Pregnancy HROB Diagnosis(es):   CHTN  [id number] 5858w6d Estimated Date of Delivery: 02/14/17    HPI: The patient is being seen today for ongoing management of above. Today she reports increased BP at home with diastolic pressures reaching 114.  Patient reports good fetal movement, denies any bleeding and no rupture of membranes symptoms or regular contractions.   BP weight and urine results reviewed and noted. Blood pressure 140/90, pulse 91, weight 187 lb 12.8 oz (85.2 kg), last menstrual period 04/17/2016.  Fundal Height:  30 Fetal Heart rate:  148 Physical Examination: Abdomen - soft, nontender, nondistended, no masses or organomegaly                                     Pelvic - examination not indicated                                     Edema:    Urinalysis: trace protein   Fetal Surveillance Testing today:  BPP  Lab and sonogram results have been reviewed. Comments:    Assessment:  1.  Pregnancy at 3458w6d,  [id number]   :  Estimated Date of Delivery: 02/14/17                         2.  CHTN, sub-optimal control                           Medication(s) Plans:  Continue baby ASA but increase labetalol 200mg  --> 400 mg BID  Treatment Plan:  Labetalol will be increased to 400 BID   Follow up in 1 week for appointment for high risk OB care, BPP  By signing my name below, I, Sonum Patel, attest that this documentation has been prepared under the direction and in the presence of Tilda BurrowJohn V Lambert Jeanty, MD. Electronically Signed: Sonum Patel, Neurosurgeoncribe. 11/28/16. 2:30 PM.  I personally performed the services described in this documentation, which was SCRIBED in my presence. The recorded information has been reviewed and considered accurate. It has been edited as necessary during review. Tilda BurrowFERGUSON,Jerimiah Wolman V, MD

## 2016-11-30 ENCOUNTER — Other Ambulatory Visit: Payer: Self-pay | Admitting: Obstetrics and Gynecology

## 2016-11-30 DIAGNOSIS — O10912 Unspecified pre-existing hypertension complicating pregnancy, second trimester: Secondary | ICD-10-CM

## 2016-12-05 ENCOUNTER — Ambulatory Visit (INDEPENDENT_AMBULATORY_CARE_PROVIDER_SITE_OTHER): Payer: Medicaid Other | Admitting: Women's Health

## 2016-12-05 ENCOUNTER — Encounter: Payer: Self-pay | Admitting: Women's Health

## 2016-12-05 ENCOUNTER — Ambulatory Visit (INDEPENDENT_AMBULATORY_CARE_PROVIDER_SITE_OTHER): Payer: Medicaid Other

## 2016-12-05 VITALS — BP 132/90 | HR 80 | Wt 190.0 lb

## 2016-12-05 DIAGNOSIS — O0993 Supervision of high risk pregnancy, unspecified, third trimester: Secondary | ICD-10-CM

## 2016-12-05 DIAGNOSIS — O0990 Supervision of high risk pregnancy, unspecified, unspecified trimester: Secondary | ICD-10-CM

## 2016-12-05 DIAGNOSIS — Z8632 Personal history of gestational diabetes: Secondary | ICD-10-CM

## 2016-12-05 DIAGNOSIS — Z3A30 30 weeks gestation of pregnancy: Secondary | ICD-10-CM

## 2016-12-05 DIAGNOSIS — Z331 Pregnant state, incidental: Secondary | ICD-10-CM

## 2016-12-05 DIAGNOSIS — O10913 Unspecified pre-existing hypertension complicating pregnancy, third trimester: Secondary | ICD-10-CM

## 2016-12-05 DIAGNOSIS — Z1389 Encounter for screening for other disorder: Secondary | ICD-10-CM

## 2016-12-05 DIAGNOSIS — O09299 Supervision of pregnancy with other poor reproductive or obstetric history, unspecified trimester: Secondary | ICD-10-CM

## 2016-12-05 DIAGNOSIS — O09293 Supervision of pregnancy with other poor reproductive or obstetric history, third trimester: Secondary | ICD-10-CM | POA: Diagnosis not present

## 2016-12-05 DIAGNOSIS — O10919 Unspecified pre-existing hypertension complicating pregnancy, unspecified trimester: Secondary | ICD-10-CM

## 2016-12-05 DIAGNOSIS — O10912 Unspecified pre-existing hypertension complicating pregnancy, second trimester: Secondary | ICD-10-CM

## 2016-12-05 LAB — POCT URINALYSIS DIPSTICK
Blood, UA: NEGATIVE
GLUCOSE UA: NEGATIVE
Ketones, UA: NEGATIVE
LEUKOCYTES UA: NEGATIVE
NITRITE UA: NEGATIVE
Protein, UA: NEGATIVE

## 2016-12-05 NOTE — Progress Notes (Signed)
High Risk Pregnancy Diagnosis(es): CHTN [id number] 2352w6d Estimated Date of Delivery: 02/14/17 BP 132/90   Pulse 80   Wt 190 lb (86.2 kg)   LMP 04/17/2016   BMI 31.62 kg/m   Urinalysis: Negative HPI:  Doing well. Mom thinks she's on too much bp meds. States she hasn't taken her meds this am, makes her sick if she takes in am, so takes around lunch time then again before going to bed. Discussed no way to know if she's on too much unless her bp too low after taking meds. To schedule next appt for after lunch, so will have taken meds prior to appt.  BP, weight, and urine reviewed.  Reports good fm. Denies regular uc's, lof, vb, uti s/s. No complaints.  Fundal Height:  29 Fetal Heart rate:  146 u/s, BPP 8/8 Edema:  trace  Reviewed ptl s/s, fkc, Recommended Tdap at HD/PCP per CDC guidelines.  All questions were answered Assessment: 6252w6d CHTN Medication(s) Plans:  Baby asa, labetalol 400mg  BID Treatment Plan:  Growth u/s @  32, 35, 38wks    BPP weekly, then 2x/wk testing nst/sono @ 32wks     Deliver PRN or 37wks Follow up in 1wk for high-risk OB appt and BPP u/s

## 2016-12-05 NOTE — Progress Notes (Signed)
US 29+6 wks,cephalic,BPP 8/8,fhr 146 bpm,normal ov's bilat,afi 11.7 cm,cx 3.2 cm,post pl gr 0

## 2016-12-05 NOTE — Patient Instructions (Addendum)
Call the office [phone]) or go to [facility] if:  You begin to have strong, frequent contractions  Your water breaks.  Sometimes it is a big gush of fluid, sometimes it is just a trickle that keeps getting your panties wet or running down your legs  You have vaginal bleeding.  It is normal to have a small amount of spotting if your cervix was checked.   You don't feel your baby moving like normal.  If you don't, get you something to eat and drink and lay down and focus on feeling your baby move.  You should feel at least 10 movements in 2 hours.  If you don't, you should call the office or go to [facility].   Tdap Vaccine  It is recommended that you get the Tdap vaccine during the third trimester of EACH pregnancy to help protect your baby from getting pertussis (whooping cough)  27-36 weeks is the BEST time to do this so that you can pass the protection on to your baby. During pregnancy is better than after pregnancy, but if you are unable to get it during pregnancy it will be offered at the hospital.   You can get this vaccine at the health department or your family doctor  Everyone who will be around your baby should also be up-to-date on their vaccines. Adults (who are not pregnant) only need 1 dose of Tdap during adulthood.      Third Trimester of Pregnancy The third trimester is from week 29 through week 40 (months 7 through 9). The third trimester is a time when the unborn baby (fetus) is growing rapidly. At the end of the ninth month, the fetus is about 20 inches in length and weighs 6-10 pounds. Body changes during your third trimester Your body goes through many changes during pregnancy. The changes vary from woman to woman. During the third trimester:  Your weight will continue to increase. You can expect to gain 25-35 pounds (11-16 kg) by the end of the pregnancy.  You may begin to get stretch marks on your hips, abdomen, and breasts.  You may urinate more  often because the fetus is moving lower into your pelvis and pressing on your bladder.  You may develop or continue to have heartburn. This is caused by increased hormones that slow down muscles in the digestive tract.  You may develop or continue to have constipation because increased hormones slow digestion and cause the muscles that push waste through your intestines to relax.  You may develop hemorrhoids. These are swollen veins (varicose veins) in the rectum that can itch or be painful.  You may develop swollen, bulging veins (varicose veins) in your legs.  You may have increased body aches in the pelvis, back, or thighs. This is due to weight gain and increased hormones that are relaxing your joints.  You may have changes in your hair. These can include thickening of your hair, rapid growth, and changes in texture. Some women also have hair loss during or after pregnancy, or hair that feels dry or thin. Your hair will most likely return to normal after your baby is born.  Your breasts will continue to grow and they will continue to become tender. A yellow fluid (colostrum) may leak from your breasts. This is the first milk you are producing for your baby.  Your belly button may stick out.  You may notice more swelling in your hands, face, or ankles.  You may have increased tingling  or numbness in your hands, arms, and legs. The skin on your belly may also feel numb.  You may feel short of breath because of your expanding uterus.  You may have more problems sleeping. This can be caused by the size of your belly, increased need to urinate, and an increase in your body's metabolism.  You may notice the fetus "dropping," or moving lower in your abdomen.  You may have increased vaginal discharge.  Your cervix becomes thin and soft (effaced) near your due date. What to expect at prenatal visits You will have prenatal exams every 2 weeks until week 36. Then you will have weekly  prenatal exams. During a routine prenatal visit:  You will be weighed to make sure you and the fetus are growing normally.  Your blood pressure will be taken.  Your abdomen will be measured to track your baby's growth.  The fetal heartbeat will be listened to.  Any test results from the previous visit will be discussed.  You may have a cervical check near your due date to see if you have effaced. At around 36 weeks, your health care provider will check your cervix. At the same time, your health care provider will also perform a test on the secretions of the vaginal tissue. This test is to determine if a type of bacteria, Group B streptococcus, is present. Your health care provider will explain this further. Your health care provider may ask you:  What your birth plan is.  How you are feeling.  If you are feeling the baby move.  If you have had any abnormal symptoms, such as leaking fluid, bleeding, severe headaches, or abdominal cramping.  If you are using any tobacco products, including cigarettes, chewing tobacco, and electronic cigarettes.  If you have any questions. Other tests or screenings that may be performed during your third trimester include:  Blood tests that check for low iron levels (anemia).  Fetal testing to check the health, activity level, and growth of the fetus. Testing is done if you have certain medical conditions or if there are problems during the pregnancy.  Nonstress test (NST). This test checks the health of your baby to make sure there are no signs of problems, such as the baby not getting enough oxygen. During this test, a belt is placed around your belly. The baby is made to move, and its heart rate is monitored during movement. What is false labor? False labor is a condition in which you feel small, irregular tightenings of the muscles in the womb (contractions) that eventually go away. These are called Braxton Hicks contractions. Contractions may  last for hours, days, or even weeks before true labor sets in. If contractions come at regular intervals, become more frequent, increase in intensity, or become painful, you should see your health care provider. What are the signs of labor?  Abdominal cramps.  Regular contractions that start at 10 minutes apart and become stronger and more frequent with time.  Contractions that start on the top of the uterus and spread down to the lower abdomen and back.  Increased pelvic pressure and dull back pain.  A watery or bloody mucus discharge that comes from the vagina.  Leaking of amniotic fluid. This is also known as your "water breaking." It could be a slow trickle or a gush. Let your doctor know if it has a color or strange odor. If you have any of these signs, call your health care provider right away, even if  it is before your due date. Follow these instructions at home: Eating and drinking  Continue to eat regular, healthy meals.  Do not eat:  Raw meat or meat spreads.  Unpasteurized milk or cheese.  Unpasteurized juice.  Store-made salad.  Refrigerated smoked seafood.  Hot dogs or deli meat, unless they are piping hot.  More than 6 ounces of albacore tuna a week.  Shark, swordfish, king mackerel, or tile fish.  Store-made salads.  Raw sprouts, such as mung bean or alfalfa sprouts.  Take prenatal vitamins as told by your health care provider.  Take 1000 mg of calcium daily as told by your health care provider.  If you develop constipation:  Take over-the-counter or prescription medicines.  Drink enough fluid to keep your urine clear or pale yellow.  Eat foods that are high in fiber, such as fresh fruits and vegetables, whole grains, and beans.  Limit foods that are high in fat and processed sugars, such as fried and sweet foods. Activity  Exercise only as directed by your health care provider. Healthy pregnant women should aim for 2 hours and 30 minutes of  moderate exercise per week. If you experience any pain or discomfort while exercising, stop.  Avoid heavy lifting.  Do not exercise in extreme heat or humidity, or at high altitudes.  Wear low-heel, comfortable shoes.  Practice good posture.  Do not travel far distances unless it is absolutely necessary and only with the approval of your health care provider.  Wear your seat belt at all times while in a car, on a bus, or on a plane.  Take frequent breaks and rest with your legs elevated if you have leg cramps or low back pain.  Do not use hot tubs, steam rooms, or saunas.  You may continue to have sex unless your health care provider tells you otherwise. Lifestyle  Do not use any products that contain nicotine or tobacco, such as cigarettes and e-cigarettes. If you need help quitting, ask your health care provider.  Do not drink alcohol.  Do not use any medicinal herbs or unprescribed drugs. These chemicals affect the formation and growth of the baby.  If you develop varicose veins:  Wear support pantyhose or compression stockings as told by your healthcare provider.  Elevate your feet for 15 minutes, 3-4 times a day.  Wear a supportive maternity bra to help with breast tenderness. General instructions  Take over-the-counter and prescription medicines only as told by your health care provider. There are medicines that are either safe or unsafe to take during pregnancy.  Take warm sitz baths to soothe any pain or discomfort caused by hemorrhoids. Use hemorrhoid cream or witch hazel if your health care provider approves.  Avoid cat litter boxes and soil used by cats. These carry germs that can cause birth defects in the baby. If you have a cat, ask someone to clean the litter box for you.  To prepare for the arrival of your baby:  Take prenatal classes to understand, practice, and ask questions about the labor and delivery.  Make a trial run to the hospital.  Visit the  hospital and tour the maternity area.  Arrange for maternity or paternity leave through employers.  Arrange for family and friends to take care of pets while you are in the hospital.  Purchase a rear-facing car seat and make sure you know how to install it in your car.  Pack your hospital bag.  Prepare the baby's nursery. Make sure  to remove all pillows and stuffed animals from the baby's crib to prevent suffocation.  Visit your dentist if you have not gone during your pregnancy. Use a soft toothbrush to brush your teeth and be gentle when you floss.  Keep all prenatal follow-up visits as told by your health care provider. This is important. Contact a health care provider if:  You are unsure if you are in labor or if your water has broken.  You become dizzy.  You have mild pelvic cramps, pelvic pressure, or nagging pain in your abdominal area.  You have lower back pain.  You have persistent nausea, vomiting, or diarrhea.  You have an unusual or bad smelling vaginal discharge.  You have pain when you urinate. Get help right away if:  You have a fever.  You are leaking fluid from your vagina.  You have spotting or bleeding from your vagina.  You have severe abdominal pain or cramping.  You have rapid weight loss or weight gain.  You have shortness of breath with chest pain.  You notice sudden or extreme swelling of your face, hands, ankles, feet, or legs.  Your baby makes fewer than 10 movements in 2 hours.  You have severe headaches that do not go away with medicine.  You have vision changes. Summary  The third trimester is from week 29 through week 40, months 7 through 9. The third trimester is a time when the unborn baby (fetus) is growing rapidly.  During the third trimester, your discomfort may increase as you and your baby continue to gain weight. You may have abdominal, leg, and back pain, sleeping problems, and an increased need to urinate.  During the  third trimester your breasts will keep growing and they will continue to become tender. A yellow fluid (colostrum) may leak from your breasts. This is the first milk you are producing for your baby.  False labor is a condition in which you feel small, irregular tightenings of the muscles in the womb (contractions) that eventually go away. These are called Braxton Hicks contractions. Contractions may last for hours, days, or even weeks before true labor sets in.  Signs of labor can include: abdominal cramps; regular contractions that start at 10 minutes apart and become stronger and more frequent with time; watery or bloody mucus discharge that comes from the vagina; increased pelvic pressure and dull back pain; and leaking of amniotic fluid. This information is not intended to replace advice given to you by your health care provider. Make sure you discuss any questions you have with your health care provider. Document Released: 12/06/2001 Document Revised: 05/19/2016 Document Reviewed: 02/12/2013 Elsevier Interactive Patient Education  2017 [employer]  How a Baby Grows During Pregnancy Introduction Pregnancy begins when a female's sperm enters a female's egg (fertilization). This happens in one of the tubes (fallopian tubes) that connect the ovaries to the womb (uterus). The fertilized egg is called an embryo until it reaches 10 weeks. From 10 weeks until birth, it is called a fetus. The fertilized egg moves down the fallopian tube to the uterus. Then it implants into the lining of the uterus and begins to grow. The developing fetus receives oxygen and nutrients through the pregnant woman's bloodstream and the tissues that grow (placenta) to support the fetus. The placenta is the life support system for the fetus. It provides nutrition and removes waste. Learning as much as you can about your pregnancy and how your baby is developing can help you enjoy  the experience. It can also make you aware of  when there might be a problem and when to ask questions. How long does a typical pregnancy last? A pregnancy usually lasts 280 days, or about 40 weeks. Pregnancy is divided into three trimesters:  First trimester: 0-13 weeks.  Second trimester: 14-27 weeks.  Third trimester: 28-40 weeks. The day when your baby is considered ready to be born (full term) is your estimated date of delivery. How does my baby develop month by month? First month  The fertilized egg attaches to the inside of the uterus.  Some cells will form the placenta. Others will form the fetus.  The arms, legs, brain, spinal cord, lungs, and heart begin to develop.  At the end of the first month, the heart begins to beat. Second month  The bones, inner ear, eyelids, hands, and feet form.  The genitals develop.  By the end of 8 weeks, all major organs are developing. Third month  All of the internal organs are forming.  Teeth develop below the gums.  Bones and muscles begin to grow. The spine can flex.  The skin is transparent.  Fingernails and toenails begin to form.  Arms and legs continue to grow longer, and hands and feet develop.  The fetus is about 3 in (7.6 cm) long. Fourth month  The placenta is completely formed.  The external sex organs, neck, outer ear, eyebrows, eyelids, and fingernails are formed.  The fetus can hear, swallow, and move its arms and legs.  The kidneys begin to produce urine.  The skin is covered with a white waxy coating (vernix) and very fine hair (lanugo). Fifth month  The fetus moves around more and can be felt for the first time (quickening).  The fetus starts to sleep and wake up and may begin to suck its finger.  The nails grow to the end of the fingers.  The organ in the digestive system that makes bile (gallbladder) functions and helps to digest the nutrients.  If your baby is a girl, eggs are present in her ovaries. If your baby is a boy, testicles  start to move down into his scrotum. Sixth month  The lungs are formed, but the fetus is not yet able to breathe.  The eyes open. The brain continues to develop.  Your baby has fingerprints and toe prints. Your baby's hair grows thicker.  At the end of the second trimester, the fetus is about 9 in (22.9 cm) long. Seventh month  The fetus kicks and stretches.  The eyes are developed enough to sense changes in light.  The hands can make a grasping motion.  The fetus responds to sound. Eighth month  All organs and body systems are fully developed and functioning.  Bones harden and taste buds develop. The fetus may hiccup.  Certain areas of the brain are still developing. The skull remains soft. Ninth month  The fetus gains about  lb (0.23 kg) each week.  The lungs are fully developed.  Patterns of sleep develop.  The fetus's head typically moves into a head-down position (vertex) in the uterus to prepare for birth. If the buttocks move into a vertex position instead, the baby is breech.  The fetus weighs 6-9 lbs (2.72-4.08 kg) and is 19-20 in (48.26-50.8 cm) long. What can I do to have a healthy pregnancy and help my baby develop?  Eating and Drinking  Eat a healthy diet.  Talk with your health care provider to make  sure that you are getting the nutrients that you and your baby need.  Visit www.[employer].be to learn about creating a healthy diet.  Gain a healthy amount of weight during pregnancy as advised by your health care provider. This is usually 25-35 pounds. You may need to:  Gain more if you were underweight before getting pregnant or if you are pregnant with more than one baby.  Gain less if you were overweight or obese when you got pregnant. Medicines and Vitamins  Take prenatal vitamins as directed by your health care provider. These include vitamins such as folic acid, iron, calcium, and vitamin D. They are important for healthy development.  Take  medicines only as directed by your health care provider. Read labels and ask a pharmacist or your health care provider whether over-the-counter medicines, supplements, and prescription drugs are safe to take during pregnancy. Activities  Be physically active as advised by your health care provider. Ask your health care provider to recommend activities that are safe for you to do, such as walking or swimming.  Do not participate in strenuous or extreme sports.  Lifestyle  Do not drink alcohol.  Do not use any tobacco products, including cigarettes, chewing tobacco, or electronic cigarettes. If you need help quitting, ask your health care provider.  Do not use illegal drugs. Safety  Avoid exposure to mercury, lead, or other heavy metals. Ask your health care provider about common sources of these heavy metals.  Avoid listeria infection during pregnancy. Follow these precautions:  Do not eat soft cheeses or deli meats.  Do not eat hot dogs unless they have been warmed up to the point of steaming, such as in the microwave oven.  Do not drink unpasteurized milk.  Avoid toxoplasmosis infection during pregnancy. Follow these precautions:  Do not change your cat's litter box, if you have a cat. Ask someone else to do this for you.  Wear gardening gloves while working in the yard. General Instructions  Keep all follow-up visits as directed by your health care provider. This is important. This includes prenatal care and screening tests.  Manage any chronic health conditions. Work closely with your health care provider to keep conditions, such as diabetes, under control. How do I know if my baby is developing well? At each prenatal visit, your health care provider will do several different tests to check on your health and keep track of your baby's development. These include:  Fundal height.  Your health care provider will measure your growing belly from top to bottom using a tape  measure.  Your health care provider will also feel your belly to determine your baby's position.  Heartbeat.  An ultrasound in the first trimester can confirm pregnancy and show a heartbeat, depending on how far along you are.  Your health care provider will check your baby's heart rate at every prenatal visit.  As you get closer to your delivery date, you may have regular fetal heart rate monitoring to make sure that your baby is not in distress.  Second trimester ultrasound.  This ultrasound checks your baby's development. It also indicates your baby's gender. What should I do if I have concerns about my baby's development? Always talk with your health care provider about any concerns that you may have. This information is not intended to replace advice given to you by your health care provider. Make sure you discuss any questions you have with your health care provider. Document Released: 05/30/2008 Document Revised: 05/19/2016 Document Reviewed:  05/21/2014  2017 Elsevier

## 2016-12-12 ENCOUNTER — Encounter: Payer: Medicaid Other | Admitting: Obstetrics & Gynecology

## 2016-12-12 ENCOUNTER — Other Ambulatory Visit: Payer: Medicaid Other

## 2016-12-14 ENCOUNTER — Telehealth: Payer: Self-pay | Admitting: *Deleted

## 2016-12-14 NOTE — Telephone Encounter (Signed)
Patient called with complaints of hot flashes and throwing up after taking her Labetolol. Her dose was increased last week from 200BID to 400BID. She was not having any problems with the 200mg . Please advise.

## 2016-12-16 ENCOUNTER — Ambulatory Visit (INDEPENDENT_AMBULATORY_CARE_PROVIDER_SITE_OTHER): Payer: Medicaid Other | Admitting: Obstetrics & Gynecology

## 2016-12-16 ENCOUNTER — Ambulatory Visit (INDEPENDENT_AMBULATORY_CARE_PROVIDER_SITE_OTHER): Payer: Medicaid Other

## 2016-12-16 ENCOUNTER — Encounter (HOSPITAL_COMMUNITY): Payer: Self-pay | Admitting: *Deleted

## 2016-12-16 VITALS — BP 128/90 | HR 80 | Wt 193.0 lb

## 2016-12-16 DIAGNOSIS — Z1389 Encounter for screening for other disorder: Secondary | ICD-10-CM | POA: Diagnosis not present

## 2016-12-16 DIAGNOSIS — O0993 Supervision of high risk pregnancy, unspecified, third trimester: Secondary | ICD-10-CM | POA: Diagnosis not present

## 2016-12-16 DIAGNOSIS — O10919 Unspecified pre-existing hypertension complicating pregnancy, unspecified trimester: Secondary | ICD-10-CM

## 2016-12-16 DIAGNOSIS — Z3A32 32 weeks gestation of pregnancy: Secondary | ICD-10-CM

## 2016-12-16 DIAGNOSIS — O10913 Unspecified pre-existing hypertension complicating pregnancy, third trimester: Secondary | ICD-10-CM

## 2016-12-16 DIAGNOSIS — Z331 Pregnant state, incidental: Secondary | ICD-10-CM | POA: Diagnosis not present

## 2016-12-16 DIAGNOSIS — O0990 Supervision of high risk pregnancy, unspecified, unspecified trimester: Secondary | ICD-10-CM

## 2016-12-16 LAB — POCT URINALYSIS DIPSTICK
GLUCOSE UA: NEGATIVE
Ketones, UA: NEGATIVE
Leukocytes, UA: NEGATIVE
NITRITE UA: NEGATIVE
PROTEIN UA: NEGATIVE
RBC UA: NEGATIVE

## 2016-12-16 MED ORDER — NIFEDIPINE ER OSMOTIC RELEASE 30 MG PO TB24: 30.0000 mg | ORAL_TABLET | Freq: Every day | ORAL | 11 refills | Status: DC

## 2016-12-16 NOTE — Progress Notes (Signed)
Fetal Surveillance Testing today:  BPP 8/8    High Risk Pregnancy Diagnosis(es):   CHTN  [id number] Estimated Date of Delivery: 02/14/17  Blood pressure 128/90, pulse 80, weight 193 lb (87.5 kg), last menstrual period 04/17/2016, unknown if currently breastfeeding.  Urinalysis: Negative   HPI: The patient is being seen today for ongoing management of CHTN. Today she reports throws up her labetalol and she hasn't been taking it regularly, did not take this am  Will stop the labetalol and switch to procardia xl 30 daily and see if that is better for her   BP weight and urine results all reviewed and noted. Patient reports good fetal movement, denies any bleeding and no rupture of membranes symptoms or regular contractions.  Fundal Height:  32 Fetal Heart rate:  146 Edema:  none  Patient is without complaints other than noted in her HPI. All questions were answered.  All lab and sonogram results have been reviewed. Comments:    Assessment:  1.  Pregnancy at 2176w3d,  Estimated Date of Delivery: 02/14/17 :                          2.  CHTN with intolerance to labetalol                        3.    Medication(s) Plans:  Procardia xl 30 daily  Treatment Plan:  Twice weekly surveillance, sonogram alternating with NST, induction at 39 weeks or as clinically indicated\  Return in about 5 days (around 12/21/2016) for NST, HROB. for appointment for high risk OB care  Meds ordered this encounter  Medications  . NIFEdipine (PROCARDIA-XL/ADALAT-CC/NIFEDICAL-XL) 30 MG 24 hr tablet    Sig: Take 1 tablet (30 mg total) by mouth daily.    Dispense:  30 tablet    Refill:  11   Orders Placed This Encounter  Procedures  . POCT urinalysis dipstick

## 2016-12-16 NOTE — Progress Notes (Signed)
BPP u/s today @ 31+[redacted] wk GA. Single active fetus with FHR 146bpm. AFI 13.7, WNL. BPP 8/8.

## 2016-12-21 ENCOUNTER — Ambulatory Visit (INDEPENDENT_AMBULATORY_CARE_PROVIDER_SITE_OTHER): Payer: Medicaid Other | Admitting: Obstetrics and Gynecology

## 2016-12-21 ENCOUNTER — Encounter: Payer: Self-pay | Admitting: Obstetrics and Gynecology

## 2016-12-21 VITALS — BP 141/81 | HR 93 | Wt 197.0 lb

## 2016-12-21 DIAGNOSIS — O09293 Supervision of pregnancy with other poor reproductive or obstetric history, third trimester: Secondary | ICD-10-CM

## 2016-12-21 DIAGNOSIS — Z3A32 32 weeks gestation of pregnancy: Secondary | ICD-10-CM

## 2016-12-21 DIAGNOSIS — Z1389 Encounter for screening for other disorder: Secondary | ICD-10-CM | POA: Diagnosis not present

## 2016-12-21 DIAGNOSIS — O0993 Supervision of high risk pregnancy, unspecified, third trimester: Secondary | ICD-10-CM

## 2016-12-21 DIAGNOSIS — O10913 Unspecified pre-existing hypertension complicating pregnancy, third trimester: Secondary | ICD-10-CM | POA: Diagnosis not present

## 2016-12-21 DIAGNOSIS — O09299 Supervision of pregnancy with other poor reproductive or obstetric history, unspecified trimester: Secondary | ICD-10-CM

## 2016-12-21 DIAGNOSIS — Z331 Pregnant state, incidental: Secondary | ICD-10-CM | POA: Diagnosis not present

## 2016-12-21 DIAGNOSIS — Z8632 Personal history of gestational diabetes: Secondary | ICD-10-CM

## 2016-12-21 DIAGNOSIS — O0990 Supervision of high risk pregnancy, unspecified, unspecified trimester: Secondary | ICD-10-CM

## 2016-12-21 LAB — POCT URINALYSIS DIPSTICK
Blood, UA: NEGATIVE
Glucose, UA: NEGATIVE
KETONES UA: NEGATIVE
Leukocytes, UA: NEGATIVE
Nitrite, UA: NEGATIVE
PROTEIN UA: NEGATIVE

## 2016-12-21 NOTE — Progress Notes (Signed)
Patient ID: Karenann Cailexandria Bunnell, female   DOB: [birth year], 25 y.o.   MRN: [id number]   High Risk Pregnancy HROB Diagnosis(es):   CHTN  [id number] 7289w1d Estimated Date of Delivery: 02/14/17    BP weight and urine results reviewed and noted. Blood pressure (!) 141/81, pulse 93, last menstrual period 04/17/2016, unknown if currently breastfeeding.  Urinalysis:NEGATIVE for all   HPI: The patient is being seen today for ongoing management of CHTN.  Chief Complaint  Patient presents with  . Routine Prenatal Visit    nst    Today she reports she is doing well.   Patient reports good fetal movement, denies any bleeding and no rupture of membranes symptoms or regular contractions.  Fundal Height:  30.5 cm Fetal Heart rate:  135 bpm Physical Examination: Abdomen - soft, nontender, nondistended, no masses or organomegaly                                     Edema:  none  Fetal Surveillance Testing today:  NST reactive  Lab and sonogram results have been reviewed.  Assessment:  1.  Pregnancy at 4089w1d,  [id number]   :  Estimated Date of Delivery: 02/14/17                         2.  CHTN  Medication(s) Plans:  Procardia xl 30 daily   Treatment Plan:  Twice weekly surveillance, sonogram alternating with NST, induction at 39 weeks or as clinically indicated  Follow up in 6 days for appointment for high risk OB care, twice-weekly testing    By signing my name below, I, Doreatha MartinEva Mathews, attest that this documentation has been prepared under the direction and in the presence of Tilda BurrowJohn V Jozelyn Kuwahara, MD. Electronically Signed: Doreatha MartinEva Mathews, ED Scribe. 12/21/16. 3:10 PM.  I personally performed the services described in this documentation, which was SCRIBED in my presence. The recorded information has been reviewed and considered accurate. It has been edited as necessary during review. Tilda BurrowFERGUSON,Sky Borboa V, MD

## 2016-12-22 ENCOUNTER — Telehealth: Payer: Self-pay | Admitting: Women's Health

## 2016-12-22 NOTE — Telephone Encounter (Signed)
Pt states she is having swelling in feet and legs, her BP medication was just switched.  Pt advised to monitor BP, elevate leg/feet when can, avoid sodium and push water.  Pt verbalized understanding.

## 2016-12-26 NOTE — L&D Delivery Note (Signed)
  Delivery Note After a brief 2nd stage, at 11:12 PM a viable female was delivered via  (Presentation: LOA ).  APGAR: , ; weight pending .  After 1 minute, the cord was clamped and cut. 40 units of pitocin diluted in 1000cc LR was infused rapidly IV.  The placenta separated spontaneously and delivered via CCT and maternal pushing effort.  It was inspected and appears to be intact with a 3 VC.    Anesthesia:  epidural Episiotomy:   Lacerations:  1st periurethral Suture Repair:  Est. Blood Loss (mL):  150  Mom to postpartum.  Baby to Couplet care / Skin to Skin.  Delivery by Barbarann EhlersM Campbell, MD under my direct supervision  CRESENZO-DISHMAN,Emlyn Maves 01/26/2017, 11:21 PM

## 2016-12-27 ENCOUNTER — Encounter: Payer: Self-pay | Admitting: Obstetrics & Gynecology

## 2016-12-27 ENCOUNTER — Ambulatory Visit (INDEPENDENT_AMBULATORY_CARE_PROVIDER_SITE_OTHER): Payer: Medicaid Other | Admitting: Obstetrics & Gynecology

## 2016-12-27 VITALS — BP 140/80 | HR 97 | Wt 197.3 lb

## 2016-12-27 DIAGNOSIS — O10919 Unspecified pre-existing hypertension complicating pregnancy, unspecified trimester: Secondary | ICD-10-CM

## 2016-12-27 DIAGNOSIS — Z331 Pregnant state, incidental: Secondary | ICD-10-CM

## 2016-12-27 DIAGNOSIS — O0990 Supervision of high risk pregnancy, unspecified, unspecified trimester: Secondary | ICD-10-CM

## 2016-12-27 DIAGNOSIS — Z3A33 33 weeks gestation of pregnancy: Secondary | ICD-10-CM | POA: Diagnosis not present

## 2016-12-27 DIAGNOSIS — O10913 Unspecified pre-existing hypertension complicating pregnancy, third trimester: Secondary | ICD-10-CM

## 2016-12-27 DIAGNOSIS — Z1389 Encounter for screening for other disorder: Secondary | ICD-10-CM

## 2016-12-27 DIAGNOSIS — O0993 Supervision of high risk pregnancy, unspecified, third trimester: Secondary | ICD-10-CM

## 2016-12-27 DIAGNOSIS — O09293 Supervision of pregnancy with other poor reproductive or obstetric history, third trimester: Secondary | ICD-10-CM | POA: Diagnosis not present

## 2016-12-27 NOTE — Progress Notes (Signed)
Fetal Surveillance Testing today:  Reactive NST   High Risk Pregnancy Diagnosis(es):   CHTN  [id number] 5467w0d Estimated Date of Delivery: 02/14/17  Blood pressure 140/80, pulse 97, weight 197 lb 4.8 oz (89.5 kg), last menstrual period 04/17/2016, unknown if currently breastfeeding.  Urinalysis: Negative   HPI: The patient is being seen today for ongoing management of CHTN. Today she reports BP is better and not throwing up the procardia 30 xl   BP weight and urine results all reviewed and noted. Patient reports good fetal movement, denies any bleeding and no rupture of membranes symptoms or regular contractions.  Fundal Height:  34 Fetal Heart rate:  140 Edema:  none  Patient is without complaints other than noted in her HPI. All questions were answered.  All lab and sonogram results have been reviewed. Comments:    Assessment:  1.  Pregnancy at 8567w0d,  Estimated Date of Delivery: 02/14/17 :                          2.  CHTN                        3.    Medication(s) Plans:  Procardia xl 30 daily  Treatment Plan:  Twice weekly surveillance, sonogram alternating with NST, induction at 39 weeks or as clinically indicated   Return in about 3 days (around 12/30/2016) for BPP/sono, HROB. for appointment for high risk OB care  No orders of the defined types were placed in this encounter.  Orders Placed This Encounter  Procedures  . US Fetal BPP W/O Non Stress  . US UA Cord Doppler

## 2016-12-29 ENCOUNTER — Ambulatory Visit (INDEPENDENT_AMBULATORY_CARE_PROVIDER_SITE_OTHER): Payer: Medicaid Other | Admitting: Advanced Practice Midwife

## 2016-12-29 ENCOUNTER — Encounter: Payer: Self-pay | Admitting: Advanced Practice Midwife

## 2016-12-29 ENCOUNTER — Ambulatory Visit (INDEPENDENT_AMBULATORY_CARE_PROVIDER_SITE_OTHER): Payer: Medicaid Other

## 2016-12-29 ENCOUNTER — Other Ambulatory Visit: Payer: Self-pay | Admitting: Obstetrics & Gynecology

## 2016-12-29 VITALS — BP 138/87 | HR 87 | Wt 198.4 lb

## 2016-12-29 DIAGNOSIS — O0993 Supervision of high risk pregnancy, unspecified, third trimester: Secondary | ICD-10-CM | POA: Diagnosis not present

## 2016-12-29 DIAGNOSIS — Z3A33 33 weeks gestation of pregnancy: Secondary | ICD-10-CM

## 2016-12-29 DIAGNOSIS — Z331 Pregnant state, incidental: Secondary | ICD-10-CM | POA: Diagnosis not present

## 2016-12-29 DIAGNOSIS — Z1389 Encounter for screening for other disorder: Secondary | ICD-10-CM | POA: Diagnosis not present

## 2016-12-29 DIAGNOSIS — O10913 Unspecified pre-existing hypertension complicating pregnancy, third trimester: Secondary | ICD-10-CM | POA: Diagnosis not present

## 2016-12-29 DIAGNOSIS — O10919 Unspecified pre-existing hypertension complicating pregnancy, unspecified trimester: Secondary | ICD-10-CM

## 2016-12-29 LAB — POCT URINALYSIS DIPSTICK
Blood, UA: NEGATIVE
Glucose, UA: NEGATIVE
KETONES UA: NEGATIVE
Leukocytes, UA: NEGATIVE
Nitrite, UA: NEGATIVE

## 2016-12-29 NOTE — Progress Notes (Signed)
Fetal Surveillance Testing today:  BPP, Dopplers, EFW   High Risk Pregnancy Diagnosis(es):   [id number] 2311w2d Estimated Date of Delivery: 02/14/17  Blood pressure 138/87, pulse 87, weight 198 lb 6.4 oz (90 kg), last menstrual period 04/17/2016, unknown if currently breastfeeding.  Urinalysis: Negative   HPI: The patient is being seen today for ongoing management of the above. Today she reports BPs at home usually normal.    BP weight and urine results all reviewed and noted. Patient reports good fetal movement, denies any bleeding and no rupture of membranes symptoms or regular contractions.  Edema:  no  Patient is without complaints other than noted in her HPI. All questions were answered.  All lab and sonogram results have been reviewed. Comments:  Normal:    US 33+2 wks,cephalic,post pl gr 1,afi 9.8 cm,fhr 138 bpm,normal ov's bilat,RI .72,.79,S/D 4.79,3.56  95th %,EFW 2043 g 36%  Assessment:  1.  Pregnancy at 5911w2d,  Estimated Date of Delivery: 02/14/17 :                          2.  CHTN, controlled on meds                        3.    Medication(s) Plans:  Continue ASA 81mg , procardia XL 30mg  qd Treatment Plan:   Baby ASA @ 12wks: Arly.Keller[X ]  Growth u/s @ 20, 28,32, 35 & 38wks     2x/wk testing nst/sono @ 32wks    Deliver @  39wks (meds):   Return for Mondays NST/THurs US. for appointment for high risk OB care  No orders of the defined types were placed in this encounter.  Orders Placed This Encounter  Procedures  . US UA Cord Doppler  . POCT urinalysis dipstick  . Biophysical profile

## 2016-12-29 NOTE — Progress Notes (Addendum)
US 33+2 wks,cephalic,post pl gr 1,afi 9.8 cm,fhr 138 bpm,normal ov's bilat,RI .72,.79,S/D 4.79,3.56  95th %,EFW 2043 g 36%,BPP 8/8

## 2017-01-02 ENCOUNTER — Encounter: Payer: Self-pay | Admitting: Obstetrics & Gynecology

## 2017-01-02 ENCOUNTER — Other Ambulatory Visit: Payer: Self-pay | Admitting: Advanced Practice Midwife

## 2017-01-02 ENCOUNTER — Ambulatory Visit (INDEPENDENT_AMBULATORY_CARE_PROVIDER_SITE_OTHER): Payer: Medicaid Other | Admitting: Obstetrics & Gynecology

## 2017-01-02 VITALS — BP 130/80 | HR 93 | Wt 199.0 lb

## 2017-01-02 DIAGNOSIS — O0993 Supervision of high risk pregnancy, unspecified, third trimester: Secondary | ICD-10-CM

## 2017-01-02 DIAGNOSIS — Z3A34 34 weeks gestation of pregnancy: Secondary | ICD-10-CM

## 2017-01-02 DIAGNOSIS — O10919 Unspecified pre-existing hypertension complicating pregnancy, unspecified trimester: Secondary | ICD-10-CM

## 2017-01-02 DIAGNOSIS — Z1389 Encounter for screening for other disorder: Secondary | ICD-10-CM | POA: Diagnosis not present

## 2017-01-02 DIAGNOSIS — O0990 Supervision of high risk pregnancy, unspecified, unspecified trimester: Secondary | ICD-10-CM

## 2017-01-02 DIAGNOSIS — Z331 Pregnant state, incidental: Secondary | ICD-10-CM

## 2017-01-02 DIAGNOSIS — O10913 Unspecified pre-existing hypertension complicating pregnancy, third trimester: Secondary | ICD-10-CM | POA: Diagnosis not present

## 2017-01-02 LAB — POCT URINALYSIS DIPSTICK
Blood, UA: NEGATIVE
GLUCOSE UA: NEGATIVE
KETONES UA: NEGATIVE
LEUKOCYTES UA: NEGATIVE
Nitrite, UA: NEGATIVE

## 2017-01-02 MED ORDER — NIFEDIPINE ER OSMOTIC RELEASE 30 MG PO TB24: 30.0000 mg | ORAL_TABLET | Freq: Two times a day (BID) | ORAL | 11 refills | Status: DC

## 2017-01-02 NOTE — Progress Notes (Signed)
Fetal Surveillance Testing today:  Reactive NST   High Risk Pregnancy Diagnosis(es):   CHTN  [id number] 70107w6d Estimated Date of Delivery: 02/14/17  Blood pressure 130/80, pulse 93, weight 199 lb (90.3 kg), last menstrual period 04/17/2016, unknown if currently breastfeeding.  Urinalysis: Positive for trace protein   HPI: The patient is being seen today for ongoing management of CHTN. Today she reports 1 headache in last couple of weeks   BP weight and urine results all reviewed and noted. Patient reports good fetal movement, denies any bleeding and no rupture of membranes symptoms or regular contractions.  Fundal Height:   Fetal Heart rate:  140 Edema:  none  Patient is without complaints other than noted in her HPI. All questions were answered.  All lab and sonogram results have been reviewed. Comments:    Assessment:  1.  Pregnancy at 84107w6d,  Estimated Date of Delivery: 02/14/17 :                          2.  CHTN                        3.    Medication(s) Plans:  Bump procarida xl up to 30 mg BID  Treatment Plan:  Twice weekly surveillance, sonogram alternating with NST, induction at 39 weeks or as clinically indicated   Return in about 3 days (around 01/05/2017) for BPP/sono, HROB. for appointment for high risk OB care  Meds ordered this encounter  Medications  . NIFEdipine (PROCARDIA-XL/ADALAT-CC/NIFEDICAL-XL) 30 MG 24 hr tablet    Sig: Take 1 tablet (30 mg total) by mouth 2 (two) times daily.    Dispense:  60 tablet    Refill:  11   Orders Placed This Encounter  Procedures  . US Fetal BPP W/O Non Stress  . US UA Cord Doppler  . POCT urinalysis dipstick

## 2017-01-05 ENCOUNTER — Ambulatory Visit (INDEPENDENT_AMBULATORY_CARE_PROVIDER_SITE_OTHER): Payer: Medicaid Other | Admitting: Obstetrics & Gynecology

## 2017-01-05 ENCOUNTER — Encounter: Payer: Self-pay | Admitting: Obstetrics & Gynecology

## 2017-01-05 ENCOUNTER — Ambulatory Visit (INDEPENDENT_AMBULATORY_CARE_PROVIDER_SITE_OTHER): Payer: Medicaid Other

## 2017-01-05 VITALS — BP 126/80 | HR 92 | Wt 204.0 lb

## 2017-01-05 DIAGNOSIS — Z331 Pregnant state, incidental: Secondary | ICD-10-CM

## 2017-01-05 DIAGNOSIS — Z3A34 34 weeks gestation of pregnancy: Secondary | ICD-10-CM

## 2017-01-05 DIAGNOSIS — O10919 Unspecified pre-existing hypertension complicating pregnancy, unspecified trimester: Secondary | ICD-10-CM

## 2017-01-05 DIAGNOSIS — O0993 Supervision of high risk pregnancy, unspecified, third trimester: Secondary | ICD-10-CM

## 2017-01-05 DIAGNOSIS — O10913 Unspecified pre-existing hypertension complicating pregnancy, third trimester: Secondary | ICD-10-CM | POA: Diagnosis not present

## 2017-01-05 DIAGNOSIS — Z1389 Encounter for screening for other disorder: Secondary | ICD-10-CM

## 2017-01-05 LAB — POCT URINALYSIS DIPSTICK
GLUCOSE UA: NEGATIVE
Ketones, UA: NEGATIVE
Leukocytes, UA: NEGATIVE
NITRITE UA: NEGATIVE
RBC UA: NEGATIVE

## 2017-01-05 NOTE — Progress Notes (Signed)
Fetal Surveillance Testing today:  BPP 8/8 with elevated Doppler flow ratios   High Risk Pregnancy Diagnosis(es):   CHTN  [id number] 5466w2d Estimated Date of Delivery: 02/14/17  Blood pressure 126/80, pulse 92, weight 204 lb (92.5 kg), last menstrual period 04/17/2016, unknown if currently breastfeeding.  Urinalysis: Positive for trace ratios   HPI: The patient is being seen today for ongoing management of as above. Today she reports BP at home running 140s-150s/80-90s, better range since increasing her to BID   BP weight and urine results all reviewed and noted. Patient reports good fetal movement, denies any bleeding and no rupture of membranes symptoms or regular contractions.  Fundal Height:  35 Fetal Heart rate:  144 Edema:  none  Patient is without complaints other than noted in her HPI. All questions were answered.  All lab and sonogram results have been reviewed. Comments:    Assessment:  1.  Pregnancy at 5766w2d,  Estimated Date of Delivery: 02/14/17 :                          2.  CHTN                        3.  Elevated Doppler flow ratios, >95%  Medication(s) Plans:  Cont procardia xl 30 BID  Treatment Plan:  Twice weekly surveillance, sonogram alt with NST deliver 37 weeks if Doppler ratios remain elevated  Return in about 4 days (around 01/09/2017) for NST, HROB. for appointment for high risk OB care  No orders of the defined types were placed in this encounter.  Orders Placed This Encounter  Procedures  . POCT urinalysis dipstick

## 2017-01-05 NOTE — Progress Notes (Signed)
US 34+2 wks,cephalic,fhr 143 bpm,post pl gr 2,RI .74,.71,95 TH%,AFI 14.7%,BPP 8/8

## 2017-01-06 ENCOUNTER — Other Ambulatory Visit: Payer: Self-pay | Admitting: *Deleted

## 2017-01-06 MED ORDER — OSELTAMIVIR PHOSPHATE 75 MG PO CAPS: 75.0000 mg | ORAL_CAPSULE | Freq: Every day | ORAL | 0 refills | Status: DC

## 2017-01-09 ENCOUNTER — Encounter: Payer: Self-pay | Admitting: Obstetrics and Gynecology

## 2017-01-09 ENCOUNTER — Ambulatory Visit (INDEPENDENT_AMBULATORY_CARE_PROVIDER_SITE_OTHER): Payer: Medicaid Other | Admitting: Obstetrics and Gynecology

## 2017-01-09 VITALS — BP 145/81 | HR 86 | Wt 208.0 lb

## 2017-01-09 DIAGNOSIS — O10913 Unspecified pre-existing hypertension complicating pregnancy, third trimester: Secondary | ICD-10-CM

## 2017-01-09 DIAGNOSIS — Z331 Pregnant state, incidental: Secondary | ICD-10-CM

## 2017-01-09 DIAGNOSIS — O0990 Supervision of high risk pregnancy, unspecified, unspecified trimester: Secondary | ICD-10-CM

## 2017-01-09 DIAGNOSIS — O0993 Supervision of high risk pregnancy, unspecified, third trimester: Secondary | ICD-10-CM

## 2017-01-09 DIAGNOSIS — Z3A36 36 weeks gestation of pregnancy: Secondary | ICD-10-CM | POA: Diagnosis not present

## 2017-01-09 DIAGNOSIS — Z1389 Encounter for screening for other disorder: Secondary | ICD-10-CM

## 2017-01-09 DIAGNOSIS — O1213 Gestational proteinuria, third trimester: Secondary | ICD-10-CM | POA: Diagnosis not present

## 2017-01-09 LAB — POCT URINALYSIS DIPSTICK
Blood, UA: NEGATIVE
Glucose, UA: NEGATIVE
Ketones, UA: NEGATIVE
LEUKOCYTES UA: NEGATIVE
NITRITE UA: NEGATIVE
PROTEIN UA: NEGATIVE

## 2017-01-09 NOTE — Progress Notes (Signed)
Patient ID: Karenann Cailexandria Izzo, female   DOB: 02/14/1991, 26 y.o.   MRN: [id number]   High Risk Pregnancy HROB Diagnosis(es):   [facility]  [id number] 7446w6d Estimated Date of Delivery: 02/14/17     HPI: The patient is being seen today for ongoing management of the above.  Chief Complaint  Patient presents with  . Routine Prenatal Visit    NST  ____  Today she reports she doing well.   Patient reports good fetal movement. She denies any bleeding and no rupture of membranes symptoms or regular contractions.  BP weight and urine results reviewed and noted. Blood pressure (!) 145/81, pulse 86, weight 208 lb (94.3 kg), last menstrual period 04/17/2016, unknown if currently breastfeeding.  Fundal Height: 36 cm  Fetal Heart rate:  157 bpm Physical Examination: Abdomen - soft, nontender, nondistended, no masses or organomegaly                               Edema:  none  Urinalysis:NEGATIVE for all  Fetal Surveillance Testing today:  NST reactive   Lab and sonogram results have been reviewed.  Assessment:  1.  Pregnancy at 446w6d,  [id number]   :  Estimated Date of Delivery: 02/14/17                         2.  CHTN group 2                        3. Elevated doppler ratios (>95% 01/05/17)  Medication(s) Plans:  Cont procardia xl 30 BID  Treatment Plan:   Twice weekly surveillance, sonogram alt with NST deliver 37 weeks if Doppler ratios remain elevated (>95% 01/05/17)  Follow up in 4 days for appointment for high risk OB care, BPP   By signing my name below, I, Doreatha MartinEva Mathews, attest that this documentation has been prepared under the direction and in the presence of Tilda BurrowJohn V Ryler Laskowski, MD. Electronically Signed: Doreatha MartinEva Mathews, ED Scribe. 01/09/17. 2:11 PM.  I personally performed the services described in this documentation, which was SCRIBED in my presence. The recorded information has been reviewed and considered accurate. It has been edited as necessary during review. Tilda BurrowFERGUSON,Laurelai Lepp V, MD

## 2017-01-12 ENCOUNTER — Other Ambulatory Visit: Payer: Medicaid Other

## 2017-01-16 ENCOUNTER — Ambulatory Visit (INDEPENDENT_AMBULATORY_CARE_PROVIDER_SITE_OTHER): Payer: Medicaid Other

## 2017-01-16 ENCOUNTER — Encounter: Payer: Self-pay | Admitting: Obstetrics & Gynecology

## 2017-01-16 ENCOUNTER — Other Ambulatory Visit: Payer: Self-pay | Admitting: Advanced Practice Midwife

## 2017-01-16 ENCOUNTER — Ambulatory Visit (INDEPENDENT_AMBULATORY_CARE_PROVIDER_SITE_OTHER): Payer: Medicaid Other | Admitting: Obstetrics & Gynecology

## 2017-01-16 VITALS — BP 120/80 | HR 84 | Wt 209.0 lb

## 2017-01-16 DIAGNOSIS — O10919 Unspecified pre-existing hypertension complicating pregnancy, unspecified trimester: Secondary | ICD-10-CM

## 2017-01-16 DIAGNOSIS — Z331 Pregnant state, incidental: Secondary | ICD-10-CM | POA: Diagnosis not present

## 2017-01-16 DIAGNOSIS — Z1389 Encounter for screening for other disorder: Secondary | ICD-10-CM | POA: Diagnosis not present

## 2017-01-16 DIAGNOSIS — O0993 Supervision of high risk pregnancy, unspecified, third trimester: Secondary | ICD-10-CM | POA: Diagnosis not present

## 2017-01-16 DIAGNOSIS — Z3A36 36 weeks gestation of pregnancy: Secondary | ICD-10-CM | POA: Diagnosis not present

## 2017-01-16 DIAGNOSIS — O10913 Unspecified pre-existing hypertension complicating pregnancy, third trimester: Secondary | ICD-10-CM | POA: Diagnosis not present

## 2017-01-16 LAB — POCT URINALYSIS DIPSTICK
GLUCOSE UA: NEGATIVE
KETONES UA: NEGATIVE
Leukocytes, UA: NEGATIVE
Nitrite, UA: NEGATIVE
Protein, UA: 1
RBC UA: NEGATIVE

## 2017-01-16 NOTE — Progress Notes (Signed)
US 35+6 wks,cephalic,fhr 149 bpm,BPP 8/8,post pl gr 2,afi 10 cm,RI .69,.64 90TH %,EFW 2478 g 25%,AC 10.5%

## 2017-01-16 NOTE — Progress Notes (Signed)
Fetal Surveillance Testing today:  BPP 8/8 with good Doppler flow   High Risk Pregnancy Diagnosis(es):   CHTN with statisticlly elevated Doppler flow  [id number] 6538w6d Estimated Date of Delivery: 02/14/17  Blood pressure 120/80, pulse 84, weight 209 lb (94.8 kg), last menstrual period 04/17/2016, unknown if currently breastfeeding.  Urinalysis: Positive for 1+ protein   HPI: The patient is being seen today for ongoing management of as above. Today she reports no headache or blurry vision   BP weight and urine results all reviewed and noted. Patient reports good fetal movement, denies any bleeding and no rupture of membranes symptoms or regular contractions.  Fundal Height:  35 Fetal Heart rate:  144 Edema:  none  Patient is without complaints other than noted in her HPI. All questions were answered.  All lab and sonogram results have been reviewed. Comments:    Assessment:  1.  Pregnancy at 8738w6d,  Estimated Date of Delivery: 02/14/17 :                          2.  CHTN on procardia xl 30                        3.  Statistically elevated Doppler ratios, but improved  Medication(s) Plans:  Cont procardia xl 30  Treatment Plan:  Twice weekly surveillance, sonogram alternating with NST, induction at 39 weeks or as clinically indicated   Return in about 3 days (around 01/19/2017) for NST, HROB. for appointment for high risk OB care  No orders of the defined types were placed in this encounter.  Orders Placed This Encounter  Procedures  . POCT urinalysis dipstick

## 2017-01-19 ENCOUNTER — Encounter: Payer: Self-pay | Admitting: Women's Health

## 2017-01-19 ENCOUNTER — Ambulatory Visit (INDEPENDENT_AMBULATORY_CARE_PROVIDER_SITE_OTHER): Payer: Medicaid Other | Admitting: Women's Health

## 2017-01-19 VITALS — BP 135/83 | HR 76 | Wt 212.0 lb

## 2017-01-19 DIAGNOSIS — Z1389 Encounter for screening for other disorder: Secondary | ICD-10-CM

## 2017-01-19 DIAGNOSIS — Z3A36 36 weeks gestation of pregnancy: Secondary | ICD-10-CM | POA: Diagnosis not present

## 2017-01-19 DIAGNOSIS — O10919 Unspecified pre-existing hypertension complicating pregnancy, unspecified trimester: Secondary | ICD-10-CM | POA: Diagnosis not present

## 2017-01-19 DIAGNOSIS — Z331 Pregnant state, incidental: Secondary | ICD-10-CM | POA: Diagnosis not present

## 2017-01-19 DIAGNOSIS — O0993 Supervision of high risk pregnancy, unspecified, third trimester: Secondary | ICD-10-CM | POA: Diagnosis not present

## 2017-01-19 DIAGNOSIS — O0990 Supervision of high risk pregnancy, unspecified, unspecified trimester: Secondary | ICD-10-CM

## 2017-01-19 DIAGNOSIS — O1213 Gestational proteinuria, third trimester: Secondary | ICD-10-CM

## 2017-01-19 LAB — POCT URINALYSIS DIPSTICK
Blood, UA: NEGATIVE
GLUCOSE UA: NEGATIVE
Ketones, UA: NEGATIVE
LEUKOCYTES UA: NEGATIVE
Nitrite, UA: NEGATIVE

## 2017-01-19 NOTE — Progress Notes (Signed)
High Risk Pregnancy Diagnosis(es): CHTN, has had slightly elevated dopplers G5P1031 1882w2d Estimated Date of Delivery: 02/14/17 BP 135/83   Pulse 76   Wt 212 lb (96.2 kg)   LMP 04/17/2016   BMI 35.28 kg/m   Urinalysis: Positive for +1 protein HPI:  Doing well, no complaints. Denies ha, visual changes, ruq/epigastric pain, n/v.   BP, weight, and urine reviewed.  Reports good fm. Denies regular uc's, lof, vb, uti s/s.   Fundal Height:  34 Fetal Heart rate:  140, reactive NST Edema:  trace  Reviewed ptl s/s, fkc All questions were answered Assessment: 4482w2d CHTN, has had slightly elevated dopplers Medication(s) Plans:  Continue procardia 30 XL BID Treatment Plan:  Growth u/s @ 38wks     2x/wk testing nst/sono    Deliver @ 39wks  Follow up on Monday for high-risk OB appt and bpp/dopp u/s

## 2017-01-19 NOTE — Patient Instructions (Addendum)
Call the office ([phone]) or go to [facility] if:  You begin to have strong, frequent contractions  Your water breaks.  Sometimes it is a big gush of fluid, sometimes it is just a trickle that keeps getting your panties wet or running down your legs  You have vaginal bleeding.  It is normal to have a small amount of spotting if your cervix was checked.   You don't feel your baby moving like normal.  If you don't, get you something to eat and drink and lay down and focus on feeling your baby move.  You should feel at least 10 movements in 2 hours.  If you don't, you should call the office or go to [facility].   Call the office ([phone]) or go to [facility] for these signs of pre-eclampsia:  Severe headache that does not go away with Tylenol  Visual changes- seeing spots, double, blurred vision  Pain under your right breast or upper abdomen that does not go away with Tums or heartburn medicine  Nausea and/or vomiting  Severe swelling in your hands, feet, and face      Preterm Labor and Birth Information The normal length of a pregnancy is 39-41 weeks. Preterm labor is when labor starts before 37 completed weeks of pregnancy. What are the risk factors for preterm labor? Preterm labor is more likely to occur in women who:  Have certain infections during pregnancy such as a bladder infection, sexually transmitted infection, or infection inside the uterus (chorioamnionitis).  Have a shorter-than-normal cervix.  Have gone into preterm labor before.  Have had surgery on their cervix.  Are younger than age 17 or older than age 35.  Are African American.  Are pregnant with twins or multiple babies (multiple gestation).  Take street drugs or smoke while pregnant.  Do not gain enough weight while pregnant.  Became pregnant shortly after having been pregnant. What are the symptoms of preterm labor? Symptoms of preterm labor include:  Cramps similar to those  that can happen during a menstrual period. The cramps may happen with diarrhea.  Pain in the abdomen or lower back.  Regular uterine contractions that may feel like tightening of the abdomen.  A feeling of increased pressure in the pelvis.  Increased watery or bloody mucus discharge from the vagina.  Water breaking (ruptured amniotic sac). Why is it important to recognize signs of preterm labor? It is important to recognize signs of preterm labor because babies who are born prematurely may not be fully developed. This can put them at an increased risk for:  Long-term (chronic) heart and lung problems.  Difficulty immediately after birth with regulating body systems, including blood sugar, body temperature, heart rate, and breathing rate.  Bleeding in the brain.  Cerebral palsy.  Learning difficulties.  Death. These risks are highest for babies who are born before 34 weeks of pregnancy. How is preterm labor treated? Treatment depends on the length of your pregnancy, your condition, and the health of your baby. It may involve:  Having a stitch (suture) placed in your cervix to prevent your cervix from opening too early (cerclage).  Taking or being given medicines, such as:  Hormone medicines. These may be given early in pregnancy to help support the pregnancy.  Medicine to stop contractions.  Medicines to help mature the baby's lungs. These may be prescribed if the risk of delivery is high.  Medicines to prevent your baby from developing cerebral palsy. If the labor happens before   34 weeks of pregnancy, you may need to stay in the hospital. What should I do if I think I am in preterm labor? If you think that you are going into preterm labor, call your health care provider right away. How can I prevent preterm labor in future pregnancies? To increase your chance of having a full-term pregnancy:  Do not use any tobacco products, such as cigarettes, chewing tobacco, and  e-cigarettes. If you need help quitting, ask your health care provider.  Do not use street drugs or medicines that have not been prescribed to you during your pregnancy.  Talk with your health care provider before taking any herbal supplements, even if you have been taking them regularly.  Make sure you gain a healthy amount of weight during your pregnancy.  Watch for infection. If you think that you might have an infection, get it checked right away.  Make sure to tell your health care provider if you have gone into preterm labor before. This information is not intended to replace advice given to you by your health care provider. Make sure you discuss any questions you have with your health care provider. Document Released: 03/03/2004 Document Revised: 05/24/2016 Document Reviewed: 05/04/2016 Elsevier Interactive Patient Education  2017 [employer]  

## 2017-01-23 ENCOUNTER — Ambulatory Visit (INDEPENDENT_AMBULATORY_CARE_PROVIDER_SITE_OTHER): Payer: Medicaid Other

## 2017-01-23 ENCOUNTER — Encounter: Payer: Self-pay | Admitting: Obstetrics and Gynecology

## 2017-01-23 ENCOUNTER — Ambulatory Visit (INDEPENDENT_AMBULATORY_CARE_PROVIDER_SITE_OTHER): Payer: Medicaid Other | Admitting: Obstetrics and Gynecology

## 2017-01-23 VITALS — BP 136/84 | HR 103 | Wt 214.2 lb

## 2017-01-23 DIAGNOSIS — O10919 Unspecified pre-existing hypertension complicating pregnancy, unspecified trimester: Secondary | ICD-10-CM

## 2017-01-23 DIAGNOSIS — Z3A37 37 weeks gestation of pregnancy: Secondary | ICD-10-CM | POA: Diagnosis not present

## 2017-01-23 DIAGNOSIS — O1213 Gestational proteinuria, third trimester: Secondary | ICD-10-CM | POA: Diagnosis not present

## 2017-01-23 DIAGNOSIS — Z331 Pregnant state, incidental: Secondary | ICD-10-CM | POA: Diagnosis not present

## 2017-01-23 DIAGNOSIS — Z3685 Encounter for antenatal screening for Streptococcus B: Secondary | ICD-10-CM

## 2017-01-23 DIAGNOSIS — O10913 Unspecified pre-existing hypertension complicating pregnancy, third trimester: Secondary | ICD-10-CM

## 2017-01-23 DIAGNOSIS — O0990 Supervision of high risk pregnancy, unspecified, unspecified trimester: Secondary | ICD-10-CM

## 2017-01-23 DIAGNOSIS — Z1389 Encounter for screening for other disorder: Secondary | ICD-10-CM

## 2017-01-23 LAB — OB RESULTS CONSOLE GBS: STREP GROUP B AG: NEGATIVE

## 2017-01-23 LAB — POCT URINALYSIS DIPSTICK
Glucose, UA: NEGATIVE
Ketones, UA: NEGATIVE
Leukocytes, UA: NEGATIVE
NITRITE UA: NEGATIVE

## 2017-01-23 NOTE — Progress Notes (Signed)
US 36+6 wks,cephalic,fhr 138 bpm,BPP 8/8,post pl gr 2,normal ov's bilat,afi 10.8 cm,RI .68,.64 90th %

## 2017-01-23 NOTE — Progress Notes (Signed)
High Risk Pregnancy HROB Diagnosis(es):   CHTN, has had slightly elevated dopplers in the past.   [id number] 6497w6d Estimated Date of Delivery: 02/14/17    HPI: The patient is being seen today for ongoing management of above. Today she has no complaints Patient reports good fetal movement, denies any bleeding and no rupture of membranes symptoms or regular contractions.   BP weight and urine results reviewed and noted. Blood pressure 136/84, pulse (!) 103, weight 214 lb 3.2 oz (97.2 kg), last menstrual period 04/17/2016, unknown if currently breastfeeding.  Fundal Height:  35 Fetal Heart rate:  150 Physical Examination: Abdomen - soft, nontender, nondistended, no masses or organomegaly                                     Pelvic - closed long posterior                                     Edema:  trace  Urinalysis: 3+ protein and trace blood  Fetal Surveillance Testing today:  BPP  Lab and sonogram results have been reviewed. Comments:   Assessment:  1.  Pregnancy at 8497w6d,  [id number]   :  Estimated Date of Delivery: 02/14/17                         2.  CHTN                        3.   Medication(s) Plans:  Continue taking procardia 30 XL BID  Treatment Plan:  Twice weekly testing  Follow up in 3 days for appointment for high risk OB care, NST  By signing my name below, I, Deborah Klein, attest that this documentation has been prepared under the direction and in the presence of Tilda BurrowJohn Ransome Helwig V, MD. Electronically Signed: Sonum Klein, Neurosurgeoncribe. 01/23/17. 2:33 PM.  I personally performed the services described in this documentation, which was SCRIBED in my presence. The recorded information has been reviewed and considered accurate. It has been edited as necessary during review. Tilda BurrowFERGUSON,Veta Dambrosia V, MD

## 2017-01-23 NOTE — Addendum Note (Signed)
Addended by: Tish FredericksonLANCASTER, Belma Dyches A on: 01/23/2017 02:54 PM   Modules accepted: Orders

## 2017-01-24 LAB — PROTEIN / CREATININE RATIO, URINE
CREATININE, UR: 126.2 mg/dL
PROTEIN UR: 270 mg/dL
PROTEIN/CREAT RATIO: 2139 mg/g{creat} — AB (ref 0–200)

## 2017-01-25 LAB — GC/CHLAMYDIA PROBE AMP
CHLAMYDIA, DNA PROBE: NEGATIVE
NEISSERIA GONORRHOEAE BY PCR: NEGATIVE

## 2017-01-26 ENCOUNTER — Inpatient Hospital Stay (HOSPITAL_COMMUNITY)
Admission: AD | Admit: 2017-01-26 | Discharge: 2017-01-28 | DRG: 774 | Disposition: A | Discharge status: Home / Self Care | Payer: Medicaid Other | Source: Ambulatory Visit | Attending: Obstetrics and Gynecology | Admitting: Obstetrics and Gynecology

## 2017-01-26 ENCOUNTER — Encounter: Payer: Self-pay | Admitting: Obstetrics & Gynecology

## 2017-01-26 ENCOUNTER — Inpatient Hospital Stay (HOSPITAL_COMMUNITY): Payer: Medicaid Other | Admitting: Anesthesiology

## 2017-01-26 ENCOUNTER — Encounter (HOSPITAL_COMMUNITY): Payer: Self-pay | Admitting: *Deleted

## 2017-01-26 ENCOUNTER — Ambulatory Visit (INDEPENDENT_AMBULATORY_CARE_PROVIDER_SITE_OTHER): Payer: Medicaid Other | Admitting: Obstetrics & Gynecology

## 2017-01-26 VITALS — BP 128/80 | HR 88 | Wt 212.0 lb

## 2017-01-26 DIAGNOSIS — O99344 Other mental disorders complicating childbirth: Secondary | ICD-10-CM | POA: Diagnosis present

## 2017-01-26 DIAGNOSIS — O99324 Drug use complicating childbirth: Secondary | ICD-10-CM | POA: Diagnosis present

## 2017-01-26 DIAGNOSIS — F329 Major depressive disorder, single episode, unspecified: Secondary | ICD-10-CM | POA: Diagnosis present

## 2017-01-26 DIAGNOSIS — O1213 Gestational proteinuria, third trimester: Secondary | ICD-10-CM

## 2017-01-26 DIAGNOSIS — O114 Pre-existing hypertension with pre-eclampsia, complicating childbirth: Principal | ICD-10-CM | POA: Diagnosis present

## 2017-01-26 DIAGNOSIS — Z3A37 37 weeks gestation of pregnancy: Secondary | ICD-10-CM

## 2017-01-26 DIAGNOSIS — F1290 Cannabis use, unspecified, uncomplicated: Secondary | ICD-10-CM | POA: Diagnosis present

## 2017-01-26 DIAGNOSIS — Z331 Pregnant state, incidental: Secondary | ICD-10-CM | POA: Diagnosis not present

## 2017-01-26 DIAGNOSIS — Z87891 Personal history of nicotine dependence: Secondary | ICD-10-CM

## 2017-01-26 DIAGNOSIS — O1002 Pre-existing essential hypertension complicating childbirth: Secondary | ICD-10-CM | POA: Diagnosis present

## 2017-01-26 DIAGNOSIS — Z1389 Encounter for screening for other disorder: Secondary | ICD-10-CM | POA: Diagnosis not present

## 2017-01-26 DIAGNOSIS — Z823 Family history of stroke: Secondary | ICD-10-CM

## 2017-01-26 DIAGNOSIS — O9952 Diseases of the respiratory system complicating childbirth: Secondary | ICD-10-CM | POA: Diagnosis present

## 2017-01-26 DIAGNOSIS — O119 Pre-existing hypertension with pre-eclampsia, unspecified trimester: Secondary | ICD-10-CM

## 2017-01-26 DIAGNOSIS — J45909 Unspecified asthma, uncomplicated: Secondary | ICD-10-CM | POA: Diagnosis present

## 2017-01-26 DIAGNOSIS — O113 Pre-existing hypertension with pre-eclampsia, third trimester: Secondary | ICD-10-CM | POA: Diagnosis not present

## 2017-01-26 DIAGNOSIS — O0993 Supervision of high risk pregnancy, unspecified, third trimester: Secondary | ICD-10-CM

## 2017-01-26 DIAGNOSIS — F1210 Cannabis abuse, uncomplicated: Secondary | ICD-10-CM

## 2017-01-26 LAB — COMPREHENSIVE METABOLIC PANEL
ALBUMIN: 3.2 g/dL — AB (ref 3.5–5.0)
ALT: 15 U/L (ref 14–54)
ANION GAP: 8 (ref 5–15)
AST: 20 U/L (ref 15–41)
Alkaline Phosphatase: 96 U/L (ref 38–126)
BILIRUBIN TOTAL: 0.6 mg/dL (ref 0.3–1.2)
BUN: 12 mg/dL (ref 6–20)
CO2: 20 mmol/L — AB (ref 22–32)
Calcium: 9.2 mg/dL (ref 8.9–10.3)
Chloride: 106 mmol/L (ref 101–111)
Creatinine, Ser: 0.55 mg/dL (ref 0.44–1.00)
GFR calc Af Amer: >60 mL/min (ref >60)
GFR calc non Af Amer: >60 mL/min (ref >60)
GLUCOSE: 111 mg/dL — AB (ref 65–99)
POTASSIUM: 4.1 mmol/L (ref 3.5–5.1)
SODIUM: 134 mmol/L — AB (ref 135–145)
Total Protein: 6.5 g/dL (ref 6.5–8.1)

## 2017-01-26 LAB — PROTEIN / CREATININE RATIO, URINE
CREATININE, URINE: 128 mg/dL
Protein Creatinine Ratio: 1.63 mg/mg{Cre} — ABNORMAL HIGH (ref 0.00–0.15)
TOTAL PROTEIN, URINE: 209 mg/dL

## 2017-01-26 LAB — RAPID URINE DRUG SCREEN, HOSP PERFORMED
Amphetamines: NOT DETECTED
BARBITURATES: NOT DETECTED
BENZODIAZEPINES: NOT DETECTED
COCAINE: NOT DETECTED
Opiates: NOT DETECTED
TETRAHYDROCANNABINOL: POSITIVE — AB

## 2017-01-26 LAB — CBC
HEMATOCRIT: 35.8 % — AB (ref 36.0–46.0)
Hemoglobin: 13 g/dL (ref 12.0–15.0)
MCH: 31.9 pg (ref 26.0–34.0)
MCHC: 36.3 g/dL — AB (ref 30.0–36.0)
MCV: 88 fL (ref 78.0–100.0)
Platelets: 288 10*3/uL (ref 150–400)
RBC: 4.07 MIL/uL (ref 3.87–5.11)
RDW: 13.2 % (ref 11.5–15.5)
WBC: 8.7 10*3/uL (ref 4.0–10.5)

## 2017-01-26 LAB — POCT URINALYSIS DIPSTICK
GLUCOSE UA: NEGATIVE
Ketones, UA: NEGATIVE
Leukocytes, UA: NEGATIVE
Nitrite, UA: NEGATIVE
Protein, UA: 3
RBC UA: NEGATIVE

## 2017-01-26 LAB — TYPE AND SCREEN
ABO/RH(D): O POS
Antibody Screen: NEGATIVE

## 2017-01-26 MED ORDER — PHENYLEPHRINE 40 MCG/ML (10ML) SYRINGE FOR IV PUSH (FOR BLOOD PRESSURE SUPPORT)
80.0000 ug | PREFILLED_SYRINGE | INTRAVENOUS | Status: DC | PRN
  Filled 2017-01-26: qty 5

## 2017-01-26 MED ORDER — LIDOCAINE HCL (PF) 1 % IJ SOLN
30.0000 mL | INTRAMUSCULAR | Status: DC | PRN
  Filled 2017-01-26: qty 30

## 2017-01-26 MED ORDER — FENTANYL 2.5 MCG/ML BUPIVACAINE 1/10 % EPIDURAL INFUSION (WH - ANES)
14.0000 mL/h | INTRAMUSCULAR | Status: DC | PRN
  Administered 2017-01-26: 14 mL/h via EPIDURAL
  Filled 2017-01-26: qty 100

## 2017-01-26 MED ORDER — FLEET ENEMA 7-19 GM/118ML RE ENEM: 1.0000 | ENEMA | RECTAL | Status: DC | PRN

## 2017-01-26 MED ORDER — LACTATED RINGERS IV SOLN: 500.0000 mL | INTRAVENOUS | Status: DC | PRN

## 2017-01-26 MED ORDER — OXYCODONE-ACETAMINOPHEN 5-325 MG PO TABS: 2.0000 | ORAL_TABLET | ORAL | Status: DC | PRN

## 2017-01-26 MED ORDER — OXYTOCIN BOLUS FROM INFUSION
500.0000 mL | Freq: Once | INTRAVENOUS | Status: AC
  Administered 2017-01-26: 500 mL via INTRAVENOUS

## 2017-01-26 MED ORDER — LIDOCAINE HCL (PF) 1 % IJ SOLN
INTRAMUSCULAR | Status: DC | PRN
  Administered 2017-01-26 (×2): 7 mL via EPIDURAL

## 2017-01-26 MED ORDER — ACETAMINOPHEN 325 MG PO TABS: 650.0000 mg | ORAL_TABLET | ORAL | Status: DC | PRN

## 2017-01-26 MED ORDER — LACTATED RINGERS IV SOLN
500.0000 mL | Freq: Once | INTRAVENOUS | Status: AC
  Administered 2017-01-26: 500 mL via INTRAVENOUS

## 2017-01-26 MED ORDER — SOD CITRATE-CITRIC ACID 500-334 MG/5ML PO SOLN: 30.0000 mL | ORAL | Status: DC | PRN

## 2017-01-26 MED ORDER — DIPHENHYDRAMINE HCL 50 MG/ML IJ SOLN: 12.5000 mg | INTRAMUSCULAR | Status: DC | PRN

## 2017-01-26 MED ORDER — LACTATED RINGERS IV SOLN
INTRAVENOUS | Status: DC
  Administered 2017-01-26: 20:00:00 via INTRAVENOUS

## 2017-01-26 MED ORDER — LABETALOL HCL 5 MG/ML IV SOLN: 20.0000 mg | INTRAVENOUS | Status: DC | PRN

## 2017-01-26 MED ORDER — EPHEDRINE 5 MG/ML INJ
10.0000 mg | INTRAVENOUS | Status: DC | PRN
  Filled 2017-01-26: qty 4

## 2017-01-26 MED ORDER — TERBUTALINE SULFATE 1 MG/ML IJ SOLN
0.2500 mg | Freq: Once | INTRAMUSCULAR | Status: DC | PRN
Start: 2017-01-26 — End: 2017-01-27
  Filled 2017-01-26: qty 1

## 2017-01-26 MED ORDER — OXYTOCIN 40 UNITS IN LACTATED RINGERS INFUSION - SIMPLE MED: 2.5000 [IU]/h | INTRAVENOUS | Status: DC

## 2017-01-26 MED ORDER — HYDRALAZINE HCL 20 MG/ML IJ SOLN: 10.0000 mg | Freq: Once | INTRAMUSCULAR | Status: DC | PRN

## 2017-01-26 MED ORDER — OXYCODONE-ACETAMINOPHEN 5-325 MG PO TABS: 1.0000 | ORAL_TABLET | ORAL | Status: DC | PRN

## 2017-01-26 MED ORDER — MISOPROSTOL 25 MCG QUARTER TABLET
25.0000 ug | ORAL_TABLET | ORAL | Status: DC
  Administered 2017-01-26 (×2): 25 ug via VAGINAL
  Filled 2017-01-26 (×2): qty 0.25
  Filled 2017-01-26 (×3): qty 1

## 2017-01-26 MED ORDER — ONDANSETRON HCL 4 MG/2ML IJ SOLN: 4.0000 mg | Freq: Four times a day (QID) | INTRAMUSCULAR | Status: DC | PRN

## 2017-01-26 MED ORDER — OXYTOCIN 40 UNITS IN LACTATED RINGERS INFUSION - SIMPLE MED
1.0000 m[IU]/min | INTRAVENOUS | Status: DC
  Administered 2017-01-26: 2 m[IU]/min via INTRAVENOUS
  Filled 2017-01-26: qty 1000

## 2017-01-26 MED ORDER — PHENYLEPHRINE 40 MCG/ML (10ML) SYRINGE FOR IV PUSH (FOR BLOOD PRESSURE SUPPORT)
80.0000 ug | PREFILLED_SYRINGE | INTRAVENOUS | Status: DC | PRN
  Filled 2017-01-26: qty 10
  Filled 2017-01-26: qty 5

## 2017-01-26 MED ORDER — FENTANYL CITRATE (PF) 100 MCG/2ML IJ SOLN
100.0000 ug | INTRAMUSCULAR | Status: DC | PRN
  Administered 2017-01-26: 100 ug via INTRAVENOUS
  Filled 2017-01-26 (×2): qty 2

## 2017-01-26 MED ORDER — TERBUTALINE SULFATE 1 MG/ML IJ SOLN
0.2500 mg | Freq: Once | INTRAMUSCULAR | Status: DC | PRN
  Filled 2017-01-26: qty 1

## 2017-01-26 NOTE — Progress Notes (Signed)
Evaluated patient for progress of labor.  She is sitting up in bed comfortable and cervix was 1.5cm thick and posterior. Baseline HR 135, mod variability, pos accels, no decels and occasional contractions.  Placed foley bulb without complication.  Placed another vaginal cytotec. Will start pitocin once foley falls out. Anticipate SVD.   Alitza Cowman L. Myrtie SomanWarden, MD PGY1 01/26/2017 5:08 PM

## 2017-01-26 NOTE — Anesthesia Procedure Notes (Addendum)
Epidural Patient location during procedure: OB Start time: 01/26/2017 9:34 PM End time: 01/26/2017 9:38 PM  Staffing Anesthesiologist: Leilani AbleHATCHETT, Cinderella Christoffersen Performed: anesthesiologist   Preanesthetic Checklist Completed: patient identified, surgical consent, pre-op evaluation, timeout performed, IV checked, risks and benefits discussed and monitors and equipment checked  Epidural Patient position: sitting Prep: site prepped and draped and DuraPrep Patient monitoring: continuous pulse ox and blood pressure Approach: midline Location: L3-L4 Injection technique: LOR air  Needle:  Needle type: Tuohy  Needle gauge: 17 G Needle length: 9 cm and 9 Needle insertion depth: 5 cm cm Catheter type: closed end flexible Catheter size: 19 Gauge Catheter at skin depth: 10 cm Test dose: negative and Other  Assessment Sensory level: T9 Events: blood not aspirated, injection not painful, no injection resistance, negative IV test and no paresthesia  Additional Notes Reason for block:procedure for pain

## 2017-01-26 NOTE — Anesthesia Pain Management Evaluation Note (Signed)
  CRNA Pain Management Visit Note  Patient: Deborah CaiAlexandria Klein, 26 y.o., female  "[facility] We have an anesthesia team available at all times to provide care throughout the hospital, including epidural management and anesthesia for C-section. I don't know your plan for the delivery whether it a natural birth, water birth, IV sedation, nitrous supplementation, doula or epidural, but we want to meet your pain goals."   1.Was your pain managed to your expectations on prior hospitalizations?   Yes   2.What is your expectation for pain management during this hospitalization?     Epidural  3.How can we help you reach that goal? Epidural when ready  Record the patient's initial score and the patient's pain goal.   Pain: 0  Pain Goal: 7 [facility] you to be able to say your pain was always managed very well.  Edison PaceWILKERSON,Lealer Marsland 01/26/2017

## 2017-01-26 NOTE — Progress Notes (Signed)
S: Patient seen & examined for progress of labor. Patient feeling contractions. Desires an epidural when appropriate, but not yet.     O:  Vitals:   01/26/17 1820 01/26/17 1931 01/26/17 1938 01/26/17 1942  BP: (!) 149/94 (!) 143/97  (!) 157/93  Pulse: 74 82  84  Resp:  17 18   Temp:   97.7 F (36.5 C)   TempSrc:   Oral   Weight:      Height:        Dilation: 5 Effacement (%): 70 Cervical Position: Posterior Station: -2 Presentation: Vertex Exam by:: Drenda FreezeFran Cresenzo-Dishmon   FHT: 140s bpm, mod var, +accels, no decels TOCO: q3-174min   A/P: Foley bulb has fallen out Plan to start pitocin 4hrs after last dose of cytotec Continue expectant management otherwise Anticipate SVD   Gorden HarmsMegan Taryn Shellhammer, MD PGY-2 01/26/2017 8:38 PM

## 2017-01-26 NOTE — H&P (Signed)
LABOR AND DELIVERY ADMISSION HISTORY AND PHYSICAL NOTE  Deborah Klein is a 26 y.o. female [phone] with IUP at 2972w2d by US presenting for IOL for cHTN and SIPE. She was seen in the office by Dr. Despina HiddenEure today and noted to have elevated doppler flow ratios and superimposed pre clampsia.  Blood pressure was WN and controlled with procardia 30mg  BID and urinalysis was 3+ for protein.  Urine protein/creatinine ratio 2 days ago was 2139mg . Patient denies any RUQ pain, HA, changes in vision, CP or SOB. She does report swelling in her face and hands  She reports positive fetal movement. She denies leakage of fluid or vaginal bleeding.  Prenatal History/Complications:  Past Medical History: Past Medical History:  Diagnosis Date  . Asthma   . Depression   . Hypertension     Past Surgical History: Past Surgical History:  Procedure Laterality Date  . DILATION AND EVACUATION    . NO PAST SURGERIES      Obstetrical History: OB History    Gravida Para Term Preterm AB Living   5 1 1   3 1    SAB TAB Ectopic Multiple Live Births   1 2     1       Social History: Social History   Social History  . Marital status: Single    Spouse name: N/A  . Number of children: N/A  . Years of education: N/A   Social History Main Topics  . Smoking status: Former Smoker    Packs/day: 0.50    Years: 2.00    Types: Cigarettes  . Smokeless tobacco: Never Used  . Alcohol use No     Comment: not now  . Drug use: No  . Sexual activity: Yes    Birth control/ protection: None   Other Topics Concern  . None   Social History Narrative  . None    Family History: Family History  Problem Relation Age of Onset  . Depression Mother   . Depression Sister   . Other Brother     killed at age 26  . Hypertension Maternal Grandmother   . Hypertension Maternal Grandfather   . Stroke Maternal Grandfather     Allergies: No Known Allergies  Prescriptions Prior to Admission  Medication Sig Dispense  Refill Last Dose  . aspirin EC 81 MG tablet Take 1 tablet (81 mg total) by mouth daily. 30 tablet 6 Taking  . Blood Pressure Monitoring (BLOOD PRESSURE MONITOR AUTOMAT) DEVI Take blood pressure BID 1 Device 0 Taking  . butalbital-acetaminophen-caffeine (FIORICET, ESGIC) 50-325-40 MG tablet Take 1-2 tablets by mouth every 6 (six) hours as needed for headache. (Patient not taking: Reported on 01/26/2017) 30 tablet 1 Not Taking  . NIFEdipine (PROCARDIA-XL/ADALAT-CC/NIFEDICAL-XL) 30 MG 24 hr tablet Take 1 tablet (30 mg total) by mouth 2 (two) times daily. 60 tablet 11 Taking  . Prenatal Vit-Fe Fumarate-FA (PNV PRENATAL PLUS MULTIVITAMIN) 27-1 MG TABS Take 1 tablet by mouth daily. 30 tablet 11 Taking     Review of Systems   All systems reviewed and negative except as stated in HPI  Blood pressure 121/72, pulse 97, temperature 98 F (36.7 C), temperature source Oral, resp. rate 18, height 5\' 5"  (1.651 m), weight 97.1 kg (214 lb), last menstrual period 04/17/2016, unknown if currently breastfeeding. General appearance: alert and cooperative Lungs: clear to auscultation bilaterally Heart: regular rate and rhythm Abdomen: soft, non-tender; bowel sounds normal Extremities: No calf swelling or tenderness Presentation: cephalic Fetal monitoring: FHR baseline 140s, mod  variability, pos accels, no decels, occasional contractions Uterine activity:  Dilation: 1 Effacement (%): Thick Station: Ballotable Exam by:: Ace Gins, RNC   Prenatal labs: ABO, Rh: O/Positive/-- (07/13 1104) Antibody: Negative (11/27 1203) Rubella: Immune RPR: Non Reactive (11/27 1203)  HBsAg: Negative (07/13 1104)  HIV: Non Reactive (11/27 1203)  GBS: Negative (01/29 0000)  1 hr Glucola: normal Genetic screening:  neg Anatomy US: normal female  Prenatal Transfer Tool  Maternal Diabetes: No Genetic Screening: Normal Maternal Ultrasounds/Referrals: Normal Fetal Ultrasounds or other Referrals:  None Maternal Substance  Abuse:  No Significant Maternal Medications:  None Significant Maternal Lab Results: None  Results for orders placed or performed during the hospital encounter of 01/26/17 (from the past 24 hour(s))  CBC   Collection Time: 01/26/17 12:15 PM  Result Value Ref Range   WBC 8.7 4.0 - 10.5 K/uL   RBC 4.07 3.87 - 5.11 MIL/uL   Hemoglobin 13.0 12.0 - 15.0 g/dL   HCT 16.1 (L) 09.6 - 04.5 %   MCV 88.0 78.0 - 100.0 fL   MCH 31.9 26.0 - 34.0 pg   MCHC 36.3 (H) 30.0 - 36.0 g/dL   RDW 40.9 81.1 - 91.4 %   Platelets 288 150 - 400 K/uL  Results for orders placed or performed in visit on 01/26/17 (from the past 24 hour(s))  POCT urinalysis dipstick   Collection Time: 01/26/17  9:12 AM  Result Value Ref Range   Color, UA     Clarity, UA     Glucose, UA neg    Bilirubin, UA     Ketones, UA neg    Spec Grav, UA     Blood, UA neg    pH, UA     Protein, UA 3    Urobilinogen, UA     Nitrite, UA neg    Leukocytes, UA Negative Negative    Patient Active Problem List   Diagnosis Date Noted  . Pregnancy 01/26/2017  . Marijuana use 11/21/2016  . History of gestational diabetes in prior pregnancy, currently pregnant 10/20/2016  . Supervision of high risk pregnancy, antepartum 07/07/2016  . Asthma   . Depression   . Chronic hypertension during pregnancy, antepartum     Assessment: Deborah Klein is a 26 y.o. [phone] at [redacted]w[redacted]d here for IOL for cHTN and SIPE.  Found to have elevated doppler flow ratio in office with 3+ protein.  Will be repeating urine protein/creatinine ratio, CBC, CMP and added PRN labetalol. Start IOL with cytotec.   #Labor: IOL with cytotec and add pitocin as appropriate. Continue expectant management, anticipate SVD #Pain: epidural #FWB:  Cat 1 #ID: GBS neg #MOF: both #MOC: nexplanon #Circ:  Yes at FT #cHTN with SIPE: Monitor BP. Elevated Protein and increased UA doppler ratio. Monitor BP closely. PIH labs pending.  Renne Musca, MD PGY-1 01/26/2017, 12:48  PM  OB FELLOW HISTORY AND PHYSICAL ATTESTATION  I have seen and examined this patient; I agree with above documentation in the resident's note.    Ernestina Penna 01/26/2017, 5:35 PM

## 2017-01-26 NOTE — Progress Notes (Signed)
Fetal Surveillance Testing today:  Reactive NST   High Risk Pregnancy Diagnosis(es):   CHTN, elevated Doppler flow ratios, now with superimposed pre eclampsia  G5P1031 4179w2d Estimated Date of Delivery: 02/14/17  Blood pressure 128/80, pulse 88, weight 212 lb (96.2 kg), last menstrual period 04/17/2016, unknown if currently breastfeeding.  Urinalysis: Positive for 3+protein   HPI: The patient is being seen today for ongoing management of as above. Today she reports no headache or blurry vision   BP weight and urine results all reviewed and noted. Patient reports good fetal movement, denies any bleeding and no rupture of membranes symptoms or regular contractions.  Fundal Height:  37 Fetal Heart rate:  140 Edema:  none  Patient is without complaints other than noted in her HPI. All questions were answered.  All lab and sonogram results have been reviewed. Comments:    Assessment:  1.  Pregnancy at 4179w2d,  Estimated Date of Delivery: 02/14/17 :                          2.  CHTN now with SIPE                        3.  Elevated Doppler flow ratios  Medication(s) Plans:  Procardia xl 30 BID  Treatment Plan:  Pr/Cr form 2 days earlier 2139 mg, with persistent 3+ protein, BP well controlled on her procardia  Now with SIPE, Dr Alysia PennaErvin has been notified and he agrees with proceeding with cervical ripening and induction of labor, reactive NST today, pt also understands and agrees with treatment plan  Return in about 11 days (around 02/06/2017) for BP check. for appointment for high risk OB care  No orders of the defined types were placed in this encounter.  Orders Placed This Encounter  Procedures  . POCT urinalysis dipstick

## 2017-01-26 NOTE — Progress Notes (Signed)
S: Patient seen & examined for progress of labor. Patient comfortable with epidural.    O:  Vitals:   01/26/17 2206 01/26/17 2210 01/26/17 2211 01/26/17 2212  BP:      Pulse: 81 87 85 85  Resp:      Temp:      TempSrc:      SpO2:  99%  99%  Weight:      Height:        Dilation: 6.5 Effacement (%): 100 Cervical Position: Posterior Station: -1 Presentation: Vertex Exam by:: Dr. Orvan Falconerampbell and Cathie BeamsFran Klein, CNM   FHT: 140s bpm, mod var, +accels, no decels TOCO: q2-393min   A/P: Pitocin has been started Patient with some bleeding, clear mucus as well. Suspect this is most likely related to cervical change, but will continue to monitor for signs of other etiologies, including abruption.  AROM performed with moderate amount of clear/pink fluid Continue expectant management otherwise Anticipate SVD   Deborah HarmsMegan Baylee Mccorkel, MD PGY-2 01/26/2017 10:28 PM

## 2017-01-26 NOTE — Anesthesia Preprocedure Evaluation (Addendum)
Anesthesia Evaluation  Patient identified by MRN, date of birth, ID band Patient awake    Reviewed: Allergy & Precautions, H&P , NPO status , Patient's Chart, lab work & pertinent test results  Airway Mallampati: II  TM Distance: >3 FB Neck ROM: full    Dental no notable dental hx.    Pulmonary neg pulmonary ROS, former smoker,    Pulmonary exam normal breath sounds clear to auscultation       Cardiovascular hypertension, negative cardio ROS Normal cardiovascular exam Rhythm:regular Rate:Normal     Neuro/Psych negative neurological ROS     GI/Hepatic negative GI ROS, Neg liver ROS,   Endo/Other  negative endocrine ROS  Renal/GU negative Renal ROS     Musculoskeletal   Abdominal (+) + obese,   Peds  Hematology negative hematology ROS (+)   Anesthesia Other Findings   Reproductive/Obstetrics (+) Pregnancy                            Anesthesia Physical Anesthesia Plan  ASA: II  Anesthesia Plan: Epidural   Post-op Pain Management:    Induction:   Airway Management Planned:   Additional Equipment:   Intra-op Plan:   Post-operative Plan:   Informed Consent: I have reviewed the patients History and Physical, chart, labs and discussed the procedure including the risks, benefits and alternatives for the proposed anesthesia with the patient or authorized representative who has indicated his/her understanding and acceptance.     Plan Discussed with:   Anesthesia Plan Comments:         Anesthesia Quick Evaluation

## 2017-01-27 ENCOUNTER — Encounter (HOSPITAL_COMMUNITY): Payer: Self-pay

## 2017-01-27 LAB — CULTURE, BETA STREP (GROUP B ONLY): Strep Gp B Culture: NEGATIVE

## 2017-01-27 LAB — GLUCOSE, CAPILLARY: GLUCOSE-CAPILLARY: 119 mg/dL — AB (ref 65–99)

## 2017-01-27 LAB — RPR: RPR: NONREACTIVE

## 2017-01-27 MED ORDER — ACETAMINOPHEN 325 MG PO TABS: 650.0000 mg | ORAL_TABLET | ORAL | Status: DC | PRN

## 2017-01-27 MED ORDER — OXYCODONE HCL 5 MG PO TABS: 10.0000 mg | ORAL_TABLET | ORAL | Status: DC | PRN

## 2017-01-27 MED ORDER — AMLODIPINE BESYLATE 5 MG PO TABS
5.0000 mg | ORAL_TABLET | Freq: Every day | ORAL | Status: DC
  Administered 2017-01-27: 5 mg via ORAL
  Filled 2017-01-27 (×3): qty 1

## 2017-01-27 MED ORDER — BISACODYL 10 MG RE SUPP: 10.0000 mg | Freq: Every day | RECTAL | Status: DC | PRN

## 2017-01-27 MED ORDER — PRENATAL MULTIVITAMIN CH
1.0000 | ORAL_TABLET | Freq: Every day | ORAL | Status: DC
  Administered 2017-01-27 – 2017-01-28 (×2): 1 via ORAL
  Filled 2017-01-27 (×2): qty 1

## 2017-01-27 MED ORDER — FERROUS SULFATE 325 (65 FE) MG PO TABS
325.0000 mg | ORAL_TABLET | Freq: Two times a day (BID) | ORAL | Status: DC
  Administered 2017-01-27 – 2017-01-28 (×3): 325 mg via ORAL
  Filled 2017-01-27 (×3): qty 1

## 2017-01-27 MED ORDER — COCONUT OIL OIL
1.0000 "application " | TOPICAL_OIL | Status: DC | PRN
  Administered 2017-01-28: 1 via TOPICAL
  Filled 2017-01-27: qty 120

## 2017-01-27 MED ORDER — OXYCODONE HCL 5 MG PO TABS: 5.0000 mg | ORAL_TABLET | ORAL | Status: DC | PRN

## 2017-01-27 MED ORDER — MEASLES, MUMPS & RUBELLA VAC ~~LOC~~ INJ
0.5000 mL | INJECTION | Freq: Once | SUBCUTANEOUS | Status: DC
  Filled 2017-01-27: qty 0.5

## 2017-01-27 MED ORDER — DIPHENHYDRAMINE HCL 25 MG PO CAPS: 25.0000 mg | ORAL_CAPSULE | Freq: Four times a day (QID) | ORAL | Status: DC | PRN

## 2017-01-27 MED ORDER — SIMETHICONE 80 MG PO CHEW: 80.0000 mg | CHEWABLE_TABLET | ORAL | Status: DC | PRN

## 2017-01-27 MED ORDER — ONDANSETRON HCL 4 MG PO TABS: 4.0000 mg | ORAL_TABLET | ORAL | Status: DC | PRN

## 2017-01-27 MED ORDER — TETANUS-DIPHTH-ACELL PERTUSSIS 5-2.5-18.5 LF-MCG/0.5 IM SUSP: 0.5000 mL | Freq: Once | INTRAMUSCULAR | Status: DC

## 2017-01-27 MED ORDER — METHYLERGONOVINE MALEATE 0.2 MG/ML IJ SOLN: 0.2000 mg | INTRAMUSCULAR | Status: DC | PRN

## 2017-01-27 MED ORDER — ZOLPIDEM TARTRATE 5 MG PO TABS: 5.0000 mg | ORAL_TABLET | Freq: Every evening | ORAL | Status: DC | PRN

## 2017-01-27 MED ORDER — DIBUCAINE 1 % RE OINT: 1.0000 "application " | TOPICAL_OINTMENT | RECTAL | Status: DC | PRN

## 2017-01-27 MED ORDER — DOCUSATE SODIUM 100 MG PO CAPS
100.0000 mg | ORAL_CAPSULE | Freq: Two times a day (BID) | ORAL | Status: DC
  Administered 2017-01-27 – 2017-01-28 (×3): 100 mg via ORAL
  Filled 2017-01-27 (×3): qty 1

## 2017-01-27 MED ORDER — IBUPROFEN 600 MG PO TABS
600.0000 mg | ORAL_TABLET | Freq: Four times a day (QID) | ORAL | Status: DC
  Administered 2017-01-27 – 2017-01-28 (×6): 600 mg via ORAL
  Filled 2017-01-27 (×6): qty 1

## 2017-01-27 MED ORDER — WITCH HAZEL-GLYCERIN EX PADS: 1.0000 "application " | MEDICATED_PAD | CUTANEOUS | Status: DC | PRN

## 2017-01-27 MED ORDER — ONDANSETRON HCL 4 MG/2ML IJ SOLN: 4.0000 mg | INTRAMUSCULAR | Status: DC | PRN

## 2017-01-27 MED ORDER — METHYLERGONOVINE MALEATE 0.2 MG PO TABS: 0.2000 mg | ORAL_TABLET | ORAL | Status: DC | PRN

## 2017-01-27 MED ORDER — BENZOCAINE-MENTHOL 20-0.5 % EX AERO
1.0000 "application " | INHALATION_SPRAY | CUTANEOUS | Status: DC | PRN
  Filled 2017-01-27: qty 56

## 2017-01-27 MED ORDER — FLEET ENEMA 7-19 GM/118ML RE ENEM: 1.0000 | ENEMA | Freq: Every day | RECTAL | Status: DC | PRN

## 2017-01-27 NOTE — Clinical Social Work Maternal (Signed)
CLINICAL SOCIAL WORK MATERNAL/CHILD NOTE  Patient Details  Name: Deborah Klein MRN: [id number] Date of Birth: 09-14-1991  Date:  01/27/2017  Clinical Social Worker Initiating Note:  Laurey Arrow Date/ Time Initiated:  01/27/17/1000     Child's Name:  Deborah Klein   Legal Guardian:  Mother   Need for Interpreter:  None   Date of Referral:  01/27/17     Reason for Referral:  Behavioral Health Issues, including SI , Current Substance Use/Substance Use During Pregnancy  (hx of depression and marijuana use. )   Referral Source:  [employer]   Address:  [place] [address]  Phone number:  [phone]   Household Members:  Self, Minor Children   Natural Supports (not living in the home):  Parent, Immediate Family, Spouse/significant other (FOB is Minta Balsam 09/16/1988)   Professional Supports: None   Employment: Part-time   Type of Work: Group Programmer, applications   Education:  Database administrator Resources:  Medicaid   Other Resources:  Physicist, medical , [employer]   Cultural/Religious Considerations Which May Impact Care:  None Reported Strengths:  Ability to meet basic needs , Understanding of illness, Home prepared for child    Risk Factors/Current Problems:      Cognitive State:  Alert , Able to Concentrate , Linear Thinking    Mood/Affect:  Anxious , Tearful , Interested , Comfortable    CSW Assessment: CSW met with MOB to complete an assessment for hx of THC use and hx of depressioin.  When CSW arrived, MOB was resting in bed with infant asleep in MOB's arms.  MOB appeared happy and was bonding with infant has evident by MOB randomly kissing infant on his forehead.  MOB was polite, inviting, and interested in meeting with CSW.  With MOB's permission, CSW asked MOB's guest (MOB's mother) to leave in effort for CSW to meet with MOB in private. MOB expressed appreciation towards CSW for not having a conversation while MOB's mother  was present.   CSW inquired about MOB's hx of depression and MOB acknowledged a hx of depression since age 47.  MOB stated that MOB's depressive symptoms initiated after the unexpected death of MOB's brother (he was age 52) by gun violence. MOB became tearful and communicated that MOB has been regulated on medication (name unknown) but discontinued the use after pregnancy confirmation. CSW expressed sympathy and validated MOB's thoughts and feelings.  MOB denied the use of outpatient therapy and was offered resources.  MOB declined resources at this time and stated the MOB will look into counseling in the near future. CSW educated MOB about PPD. CSW informed MOB of possible supports and interventions to decrease PPD.  CSW also encouraged MOB to seek medical attention if needed for increased signs and symptoms for PPD.  CSW also inquired about MOB's marijuana use.  MOB was open and honest about MOB's use throughout pregnancy.  MOB became tearful again and expressed that MOB did not want to be looked at as a bad parent.  CSW assured MOB that CSW was not there to judge MOB and offered MOB resources for substance abuse.  MOB denied that MOB had an addiction and reported that MOB used marijuana to assist MOB with managing MOB's headaches. MOB stated that MOB smoked about 1/2 joint daily and last use was January 2018.  CSW informed MOB of the hospital's drug screen policy, and informed MOB of the 2 screenings for the infant. MOB appeared  understanding. CSW shared with MOB that the infant's UDS and CDS were pending and if either are positive for any substance, CSW would make a report to [facility].  MOB did not have any questions regarding the hospital's policy    Infant's UDS was positive for THC and CSW updated MOB of infant's results.  A CPS report with made with [facility] worker, Guinevere Scarlet.  There are no barriers to d/c. CPS will follow-up with MOB within 72 hours.      CSW  Plan/Description:  Child Protective Service Report , Information/Referral to [employer] , No Further Intervention Required/No Barriers to Discharge, Patient/Family Education     Dimple Nanas, LCSW 01/27/2017, 2:57 PM

## 2017-01-27 NOTE — Anesthesia Postprocedure Evaluation (Signed)
Anesthesia Post Note  Patient: Deborah Klein  Procedure(s) Performed: * No procedures listed *  Patient location during evaluation: Mother Baby Anesthesia Type: Epidural Level of consciousness: awake, awake and alert, oriented and patient cooperative Pain management: pain level controlled Vital Signs Assessment: post-procedure vital signs reviewed and stable Respiratory status: spontaneous breathing, nonlabored ventilation and respiratory function stable Cardiovascular status: stable Postop Assessment: no headache, no backache, patient able to bend at knees and no signs of nausea or vomiting Anesthetic complications: no        Last Vitals:  Vitals:   01/27/17 0204 01/27/17 0500  BP: (!) 145/88 128/79  Pulse: 84 82  Resp: 20 18  Temp: 36.6 C 37.2 C    Last Pain:  Vitals:   01/27/17 0604  TempSrc:   PainSc: 2    Pain Goal: Patients Stated Pain Goal: 2 (01/26/17 1936)               Woodson Macha L

## 2017-01-27 NOTE — Progress Notes (Cosign Needed)
POSTPARTUM PROGRESS NOTE  Post Partum Day 1 Subjective:  Deborah Klein is a 26 y.o. [id number] 2875w2d s/p SVD @ 23:12 after induction of labor for chronic HTN & superimposed pre-eclampsia .  No acute events overnight.  Pt denies problems with ambulating or po intake. She reports some dysuria still occurring.   She denies nausea or vomiting.  Pain is well controlled.  She has not had flatus. She has not had bowel movement.  Lochia Moderate.   Objective: Blood pressure 128/79, pulse 82, temperature 99 F (37.2 C), temperature source Oral, resp. rate 18, height 5\' 5"  (1.651 m), weight 97.1 kg (214 lb), last menstrual period 04/17/2016, SpO2 98 %, unknown if currently breastfeeding.  Physical Exam:  General: alert, cooperative and no distress Lochia:normal flow Chest: CTAB Heart: RRR no m/r/g Abdomen: +BS, soft, nontender,  Uterine Fundus: firm,  DVT Evaluation: No calf swelling or tenderness Extremities: no edema   Recent Labs  01/26/17 1215  HGB 13.0  HCT 35.8*    Assessment/Plan:  ASSESSMENT: Deborah Klein is a 26 y.o. [id number] 2075w2d s/p SVD @ 23:12 after induction of labor 2/2 CHTN & SIPE  Plan for discharge tomorrow and Contraception Nexplanon; Circumcision will be completed outpatient; monitor Blood pressures due to chronic HTN   LOS: 1 day   Collie Siadgar Philemon Riedesel, Medical Student MS3 [facility]  01/27/2017, 9:23 AM

## 2017-01-27 NOTE — Lactation Note (Addendum)
This note was copied from a baby's chart. Lactation Consultation Note  Baby 12 hours old. P2.  4343w2d.  < 6 lbs. Attempted latching but baby licked but did not latch. Attempted spoon feeding, syringe feeding and slow flow nipple. Baby gagging and spitty.  Baby not receptive to feeding. Reviewed LPI feeding plan and suggest mother pump q 3 hours if baby is not latching.  If baby latches then mother should post pump 4-6 times a day for 10-15 min. Reviewed hand expression and spoon fed drops to baby of breastmilk and formula. Reported feeding to [name] RN. Noted baby was tongue thrusting.  Mother's nipple evert w/ stimulation.     Patient Name: [name] QMVHQ'IToday's Date: 01/27/2017 Reason for consult: Follow-up assessment   Maternal Data    Feeding    [facility]/Interventions                      Lactation Tools Discussed/Used     Consult Status      Hardie PulleyBerkelhammer, [name] 01/27/2017, 11:56 AM

## 2017-01-27 NOTE — Lactation Note (Signed)
This note was copied from a baby's chart. Lactation Consultation Note  Patient Name: Deborah Klein WGNFA'OToday's Date: 01/27/2017 Reason for consult: Follow-up assessment Baby at 20 hr of life. RN requesting lactation because baby is not feeding well. Baby will not latch and lets the formula run out of his mouth with the slow flow bottle nipple. With some effort baby will suck on a gloved finger. Baby settled into a nice sucking pattern and took 5ml of formula with 63Fr. Baby does a lot of biting down before sucking and takes frequent breaks. During the breaks from sucking the baby's tongue will quiver. Report given to RN.   Maternal Data    Feeding Feeding Type: Formula Nipple Type: Slow - flow  LATCH Score/Interventions                      Lactation Tools Discussed/Used Tools: 63F feeding tube / Syringe   Consult Status Consult Status: Follow-up Date: 01/28/17 Follow-up type: In-patient    [name] E Verdean Murin 01/27/2017, 8:51 PM

## 2017-01-27 NOTE — Lactation Note (Signed)
This note was copied from a baby's chart. Lactation Consultation Note CN RN called LC into rm. To work with baby d/t poor suckle. RN gave baby colostrum w/spoon d/t wouldn't suckle on bottle. RN has been monitoring CBG's. LC attempted suck training, baby wouldn't suckle and had no interest in sucking even on gloved finger. Baby has 10ml colostrum w/spoon.encouraged mom to be strict w/I&O. Call RN if baby will not feed. If hasn't cued to feed by 2 1/2-3 hrs wake baby up and stimulate. Alert staff if difficulty waking. Patient Name: Deborah Klein [name] BJYNW'GToday's Date: 01/27/2017 Reason for consult: Follow-up assessment;Infant < 6lbs   Maternal Data Has patient been taught Hand Expression?: Yes Does the patient have breastfeeding experience prior to this delivery?: No  Feeding Feeding Type: Breast Milk  LATCH Score/Interventions                Intervention(s): Breastfeeding basics reviewed;Support Pillows;Position options;Skin to skin     Lactation Tools Discussed/Used Tools: Pump Breast pump type: Double-Electric Breast Pump   Consult Status Consult Status: Follow-up Date: 01/27/17 Follow-up type: In-patient    Charyl DancerCARVER, Amalie Koran G 01/27/2017, 6:49 AM

## 2017-01-27 NOTE — Progress Notes (Signed)
Pt c/o feeling jittery and pain in lower back. BP=142/92  CBG=119 Pt just finished eating lunch. Patient instructed to rest a little while and I would repeat BP.

## 2017-01-27 NOTE — Lactation Note (Signed)
This note was copied from a baby's chart. Lactation Consultation Note Mom is pumping and bottle feeding. Mom is giving formula. Changed to 22 cal. Similac Neosure. Gave feeding amount information sheet for formula feeding. Mom is pumping. Instructed if she gets colostrum from pumping, hand express after pumping and give colostrum to baby. Then give formula and subtract from amount according to hours of age. Mom has good understanding. Hand expression taught, hand expressed 8 ml colostrum.  Mom has large pendulum breast w.semi flat nipples. Everts when stimulated.  Stressed importance of I&O, supply and demand. Baby weights 5.1 lbs stressed importance of feedings. Educated newborn behavior of baby's less than 6 lbs and 37 weeks.  [facility]/LC brochure given w/resources, support groups and LC services. Patient Name: Deborah Klein Today's Date: 01/27/2017 Reason for consult: Initial assessment   Maternal Data Has patient been taught Hand Expression?: Yes Does the patient have breastfeeding experience prior to this delivery?: No  Feeding Feeding Type: Bottle Fed - Formula Nipple Type: Slow - flow Length of feed: 15 min  LATCH Score/Interventions Latch: Repeated attempts needed to sustain latch, nipple held in mouth throughout feeding, stimulation needed to elicit sucking reflex. Intervention(s): Assist with latch;Adjust position;Breast massage  Audible Swallowing: A few with stimulation Intervention(s): Skin to skin;Hand expression Intervention(s): Skin to skin;Hand expression  Type of Nipple: Flat  Comfort (Breast/Nipple): Soft / non-tender     Hold (Positioning): Assistance needed to correctly position infant at breast and maintain latch. Intervention(s): Breastfeeding basics reviewed;Support Pillows;Position options;Skin to skin  LATCH Score: 6  Lactation Tools Discussed/Used Tools: Pump Breast pump type: Double-Electric Breast Pump Pump Review: Setup, frequency, and  cleaning;Milk Storage Initiated by:: Jeri LagerK. Shemo RN  Date initiated:: 01/27/17   Consult Status Consult Status: Follow-up Date: 01/28/17 Follow-up type: In-patient    [name], Diamond NickelLAURA G 01/27/2017, 3:28 AM

## 2017-01-28 DIAGNOSIS — Z3A37 37 weeks gestation of pregnancy: Secondary | ICD-10-CM

## 2017-01-28 DIAGNOSIS — O113 Pre-existing hypertension with pre-eclampsia, third trimester: Secondary | ICD-10-CM

## 2017-01-28 DIAGNOSIS — F1210 Cannabis abuse, uncomplicated: Secondary | ICD-10-CM

## 2017-01-28 MED ORDER — IBUPROFEN 600 MG PO TABS: 600.0000 mg | ORAL_TABLET | Freq: Four times a day (QID) | ORAL | 0 refills | Status: DC | PRN

## 2017-01-28 MED ORDER — AMLODIPINE BESYLATE 10 MG PO TABS
10.0000 mg | ORAL_TABLET | Freq: Every day | ORAL | Status: DC
Start: 2017-01-28 — End: 2017-01-28
  Administered 2017-01-28: 10 mg via ORAL
  Filled 2017-01-28: qty 1

## 2017-01-28 MED ORDER — AMLODIPINE BESYLATE 10 MG PO TABS: 10.0000 mg | ORAL_TABLET | Freq: Every day | ORAL | 1 refills | Status: DC

## 2017-01-28 NOTE — Discharge Summary (Signed)
OB Discharge Summary     Patient Name: Deborah Klein DOB: 1991-02-12 MRN: [id number]  Date of admission: 01/26/2017 Delivering MD: Gorden Harms C   Date of discharge: 01/28/2017  Admitting diagnosis: INDUCTION Intrauterine pregnancy: [redacted]w[redacted]d     Secondary diagnosis:  Active Problems:   Pregnancy  Additional problems: cHTN; +THC in preg; depression; asthma     Discharge diagnosis: Term Pregnancy Delivered and CHTN with superimposed preeclampsia                                                                                                Post partum procedures:none  Augmentation: AROM, Pitocin, Cytotec and Foley Balloon  Complications: None  Hospital course:  Induction of Labor With Vaginal Delivery   26 y.o. yo [phone] at [redacted]w[redacted]d was admitted to the hospital 01/26/2017 for induction of labor.  Indication for induction: cHTN w/ SIPE.  Patient had cx ripening and then progressed to SVD and had an essentially uncomplicated labor course as follows: Membrane Rupture Time/Date: 10:17 PM ,01/26/2017   Intrapartum Procedures: Episiotomy: None [1]                                         Lacerations:  Periurethral [8]  Patient had delivery of a Viable infant.  Information for the patient's newborn:  Deborah, Klein [[id number]]  Delivery Method: Vaginal, Spontaneous Delivery (Filed from Delivery Summary)   01/26/2017  Details of delivery can be found in separate delivery note.  Patient had a routine postpartum course with the addition of being started on Norvasc on PPD#1 initially at 5mg  and then increased to 10mg  by PPD#2. Patient is discharged home 01/28/17.  Physical exam  Vitals:   01/27/17 1710 01/27/17 1805 01/27/17 1945 01/28/17 0500  BP: (!) 145/83 (!) 159/99 132/84 (!) 145/101  Pulse:  84 80 80  Resp:  20 18 18   Temp:  98.5 F (36.9 C)  98.1 F (36.7 C)  TempSrc:  Oral  Oral  SpO2:      Weight:      Height:       General: alert and cooperative Lochia:  appropriate Uterine Fundus: firm Incision: N/A DVT Evaluation: No evidence of DVT seen on physical exam. Labs: Lab Results  Component Value Date   WBC 8.7 01/26/2017   HGB 13.0 01/26/2017   HCT 35.8 (L) 01/26/2017   MCV 88.0 01/26/2017   PLT 288 01/26/2017   CMP Latest Ref Rng & Units 01/26/2017  Glucose 65 - 99 mg/dL 696(E)  BUN 6 - 20 mg/dL 12  Creatinine 9.52 - 8.41 mg/dL 3.24  Sodium 401 - 027 mmol/L 134(L)  Potassium 3.5 - 5.1 mmol/L 4.1  Chloride 101 - 111 mmol/L 106  CO2 22 - 32 mmol/L 20(L)  Calcium 8.9 - 10.3 mg/dL 9.2  Total Protein 6.5 - 8.1 g/dL 6.5  Total Bilirubin 0.3 - 1.2 mg/dL 0.6  Alkaline Phos 38 - 126 U/L 96  AST 15 - 41 U/L 20  ALT 14 - 54  U/L 15    Discharge instruction: per After Visit Summary and "Baby and Me Booklet".  After visit meds:  Allergies as of 01/28/2017   No Known Allergies     Medication List    STOP taking these medications   aspirin EC 81 MG tablet   Blood Pressure Monitor Automat Devi   butalbital-acetaminophen-caffeine 50-325-40 MG tablet Commonly known as:  FIORICET, ESGIC   NIFEdipine 30 MG 24 hr tablet Commonly known as:  PROCARDIA-XL/ADALAT-CC/NIFEDICAL-XL     TAKE these medications   amLODipine 10 MG tablet Commonly known as:  NORVASC Take 1 tablet (10 mg total) by mouth daily.   ibuprofen 600 MG tablet Commonly known as:  ADVIL,MOTRIN Take 1 tablet (600 mg total) by mouth every 6 (six) hours as needed.   PNV PRENATAL PLUS MULTIVITAMIN 27-1 MG Tabs Take 1 tablet by mouth daily.       Diet: routine diet  Activity: Advance as tolerated. Pelvic rest for 6 weeks.   Outpatient follow up:1 wk for BP check, then 4-6 wk PP visit Follow up Appt:Future Appointments Date Time Provider Department Center  01/30/2017 3:00 PM [facility]TR[facility] None  01/30/2017 3:30 PM Cheral MarkerKimberly R Booker, CNM [facility]  02/06/2017 10:45 AM Cheral MarkerKimberly R Booker, CNM [facility]   Follow up Visit:No Follow-up on  file.  Postpartum contraception: Nexplanon  Newborn Data: Live born female  Birth Weight: 5 lb 7.3 oz (2475 g) APGAR: 8, 9  Baby Feeding: Bottle and Breast Disposition:home with mother   01/28/2017 Cam HaiSHAW, Monna Crean, CNM  10:03 AM

## 2017-01-28 NOTE — Lactation Note (Signed)
This note was copied from a baby'Klein chart. Lactation Consultation Note  Patient Name: Deborah Klein Today'Klein Date: 01/28/2017  Follow up visit made. Mom states baby is taking more volume with bottle and suck improving.  She has not pumped since yesterday.  Reviewed supply and demand and encouraged to pump 8-12 times/24 hours.  Mom states she plans on purchasing a pump.  Lactation outpatient services and support information reviewed and encouraged.   Maternal Data    Feeding Feeding Type: Bottle Fed - Formula  LATCH Score/Interventions                      Lactation Tools Discussed/Used     Consult Status      Deborah Klein, Deborah Klein 01/28/2017, 10:39 AM

## 2017-01-28 NOTE — Discharge Instructions (Signed)

## 2017-01-30 ENCOUNTER — Encounter: Payer: Medicaid Other | Admitting: Women's Health

## 2017-01-30 ENCOUNTER — Other Ambulatory Visit: Payer: Medicaid Other

## 2017-02-06 ENCOUNTER — Encounter: Payer: Medicaid Other | Admitting: Women's Health

## 2017-02-13 ENCOUNTER — Encounter: Payer: Medicaid Other | Admitting: Women's Health

## 2017-02-17 ENCOUNTER — Encounter: Payer: Self-pay | Admitting: Obstetrics & Gynecology

## 2017-02-17 ENCOUNTER — Ambulatory Visit (INDEPENDENT_AMBULATORY_CARE_PROVIDER_SITE_OTHER): Payer: Medicaid Other | Admitting: Obstetrics & Gynecology

## 2017-02-17 VITALS — BP 102/70 | HR 74 | Wt 189.0 lb

## 2017-02-17 DIAGNOSIS — Z0130 Encounter for examination of blood pressure without abnormal findings: Secondary | ICD-10-CM | POA: Diagnosis not present

## 2017-02-23 ENCOUNTER — Telehealth: Payer: Self-pay | Admitting: Obstetrics & Gynecology

## 2017-02-23 NOTE — Telephone Encounter (Signed)
Patient stated she did not call.

## 2017-02-23 NOTE — Telephone Encounter (Signed)
Pt called stating that she would like a call back from the nurse, Pt did not state the reason why. Please contact pt °

## 2017-03-01 ENCOUNTER — Ambulatory Visit (INDEPENDENT_AMBULATORY_CARE_PROVIDER_SITE_OTHER): Payer: Medicaid Other | Admitting: Women's Health

## 2017-03-01 ENCOUNTER — Encounter: Payer: Self-pay | Admitting: Women's Health

## 2017-03-01 DIAGNOSIS — I10 Essential (primary) hypertension: Secondary | ICD-10-CM | POA: Insufficient documentation

## 2017-03-01 NOTE — Progress Notes (Addendum)
Subjective:    Karenann Cailexandria Mulroy is a 26 y.o. [phone] Caucasian female who presents for a postpartum visit. She is 5 weeks postpartum following a spontaneous vaginal delivery at 37.2 gestational weeks [facility] Anesthesia: epidural. I have fully reviewed the prenatal and intrapartum course. D/c'd on norvasc 5mg - stopped taking ~1wk ago. Was non-compliant w/ bp meds prior to pregnancy. Postpartum course has been uncomplicated. Baby's course has been uncomplicated. Baby is feeding by breast at first, now bottle. Bleeding no bleeding. Bowel function is normal. Bladder function is normal. Patient is sexually active. Last sexual activity: 3days ago. Contraception method is condoms and wants nexplanon. Postpartum depression screening: negative. Score 2.  Last pap July 2017 and was normal.  The following portions of the patient's history were reviewed and updated as appropriate: allergies, current medications, past medical history, past surgical history and problem list.  Review of Systems Pertinent items are noted in HPI.   Vitals:   03/01/17 1054  BP: 104/72  Pulse: 76  Weight: 194 lb (88 kg)  Height: 5\' 5"  (1.651 m)   No LMP recorded.  Objective:   General:  alert, cooperative and no distress   Breasts:  deferred, no complaints  Lungs: clear to auscultation bilaterally  Heart:  regular rate and rhythm  Abdomen: soft, nontender   Vulva: normal  Vagina: normal vagina  Cervix:  closed  Corpus: Well-involuted  Adnexa:  Non-palpable  Rectal Exam: No hemorrhoids        Assessment:   Postpartum exam 5 wks s/p SVB after IOL for CHTN w/ SIPE Bottlefeeding Depression screening Contraception counseling   Plan:  Stopped taking norvasc ~1wk ago, will recheck her bp in 3wks w/ nexplanon insertion, if still ok, can stay off meds for now Contraception: abstinence until nexplanon inserted, order nexplanon today Follow up in: 3 weeks for nexplanon insertion, or earlier if  needed  Marge DuncansBooker, Jasmyne Lodato Randall CNM, [facility] 03/01/2017 10:58 AM

## 2017-03-01 NOTE — Patient Instructions (Signed)
NO SEX UNTIL AFTER YOU GET YOUR BIRTH CONTROL   Etonogestrel implant What is this medicine? ETONOGESTREL (et oh noe JES trel) is a contraceptive (birth control) device. It is used to prevent pregnancy. It can be used for up to 3 years. This medicine may be used for other purposes; ask your health care provider or pharmacist if you have questions. COMMON BRAND NAME(S): Implanon, Nexplanon What should I tell my health care provider before I take this medicine? They need to know if you have any of these conditions: -abnormal vaginal bleeding -blood vessel disease or blood clots -cancer of the breast, cervix, or liver -depression -diabetes -gallbladder disease -headaches -heart disease or recent heart attack -high blood pressure -high cholesterol -kidney disease -liver disease -renal disease -seizures -tobacco smoker -an unusual or allergic reaction to etonogestrel, other hormones, anesthetics or antiseptics, medicines, foods, dyes, or preservatives -pregnant or trying to get pregnant -breast-feeding How should I use this medicine? This device is inserted just under the skin on the inner side of your upper arm by a health care professional. Talk to your pediatrician regarding the use of this medicine in children. Special care may be needed. Overdosage: If you think you have taken too much of this medicine contact a poison control center or emergency room at once. NOTE: This medicine is only for you. Do not share this medicine with others. What if I miss a dose? This does not apply. What may interact with this medicine? Do not take this medicine with any of the following medications: -amprenavir -bosentan -fosamprenavir This medicine may also interact with the following medications: -barbiturate medicines for inducing sleep or treating seizures -certain medicines for fungal infections like ketoconazole and itraconazole -grapefruit juice -griseofulvin -medicines to treat  seizures like carbamazepine, felbamate, oxcarbazepine, phenytoin, topiramate -modafinil -phenylbutazone -rifampin -rufinamide -some medicines to treat HIV infection like atazanavir, indinavir, lopinavir, nelfinavir, tipranavir, ritonavir -St. John's wort This list may not describe all possible interactions. Give your health care provider a list of all the medicines, herbs, non-prescription drugs, or dietary supplements you use. Also tell them if you smoke, drink alcohol, or use illegal drugs. Some items may interact with your medicine. What should I watch for while using this medicine? This product does not protect you against HIV infection (AIDS) or other sexually transmitted diseases. You should be able to feel the implant by pressing your fingertips over the skin where it was inserted. Contact your doctor if you cannot feel the implant, and use a non-hormonal birth control method (such as condoms) until your doctor confirms that the implant is in place. If you feel that the implant may have broken or become bent while in your arm, contact your healthcare provider. What side effects may I notice from receiving this medicine? Side effects that you should report to your doctor or health care professional as soon as possible: -allergic reactions like skin rash, itching or hives, swelling of the face, lips, or tongue -breast lumps -changes in emotions or moods -depressed mood -heavy or prolonged menstrual bleeding -pain, irritation, swelling, or bruising at the insertion site -scar at site of insertion -signs of infection at the insertion site such as fever, and skin redness, pain or discharge -signs of pregnancy -signs and symptoms of a blood clot such as breathing problems; changes in vision; chest pain; severe, sudden headache; pain, swelling, warmth in the leg; trouble speaking; sudden numbness or weakness of the face, arm or leg -signs and symptoms of liver injury like dark yellow   or brown  urine; general ill feeling or flu-like symptoms; light-colored stools; loss of appetite; nausea; right upper belly pain; unusually weak or tired; yellowing of the eyes or skin -unusual vaginal bleeding, discharge -signs and symptoms of a stroke like changes in vision; confusion; trouble speaking or understanding; severe headaches; sudden numbness or weakness of the face, arm or leg; trouble walking; dizziness; loss of balance or coordination Side effects that usually do not require medical attention (report to your doctor or health care professional if they continue or are bothersome): -acne -back pain -breast pain -changes in weight -dizziness -general ill feeling or flu-like symptoms -headache -irregular menstrual bleeding -nausea -sore throat -vaginal irritation or inflammation This list may not describe all possible side effects. Call your doctor for medical advice about side effects. You may report side effects to FDA at 1-800-FDA-1088. Where should I keep my medicine? This drug is given in a hospital or clinic and will not be stored at home. NOTE: This sheet is a summary. It may not cover all possible information. If you have questions about this medicine, talk to your doctor, pharmacist, or health care provider.  2018 Elsevier/Gold Standard (2016-06-30 11:19:22)  

## 2017-03-10 ENCOUNTER — Ambulatory Visit: Payer: Medicaid Other | Admitting: Women's Health

## 2017-03-19 NOTE — Progress Notes (Signed)
      Chief Complaint  Patient presents with  . Follow-up    blood pressure check    Blood pressure 102/70, pulse 74, weight 189 lb (85.7 kg), not currently breastfeeding.  26 y.o. [id number] No LMP recorded. The current method of family planning is none.  Outpatient Encounter Prescriptions as of 02/17/2017  Medication Sig  . amLODipine (NORVASC) 10 MG tablet Take 1 tablet (10 mg total) by mouth daily. (Patient not taking: Reported on 03/01/2017)  . [DISCONTINUED] ibuprofen (ADVIL,MOTRIN) 600 MG tablet Take 1 tablet (600 mg total) by mouth every 6 (six) hours as needed.  . [DISCONTINUED] Prenatal Vit-Fe Fumarate-FA (PNV PRENATAL PLUS MULTIVITAMIN) 27-1 MG TABS Take 1 tablet by mouth daily.   No facility-administered encounter medications on file as of 02/17/2017.     Subjective Pt delivered 3 weeks ago Maintained on Norvasc 10, she is without complaints, says her BP t home have been good as well  Objective blood pressure 102/70   Pertinent ROS No cns symptoms no headache  Labs or studies     Impression Diagnoses this Encounter::   ICD-9-CM ICD-10-CM   1. BP check V81.1 Z01.30     Established relevant diagnosis(es): CHTN with SIPE  Plan/Recommendations: No orders of the defined types were placed in this encounter.   Labs or Scans Ordered: No orders of the defined types were placed in this encounter.   Management:: Stop norvasc  Follow up Return in about 1 week (around 02/24/2017) for post partum visit. And check BP off norvasc       Face to face time:  10 minutes  Greater than 50% of the visit time was spent in counseling and coordination of care with the patient.  The summary and outline of the counseling and care coordination is summarized in the note above.   All questions were answered.  Past Medical History:  Diagnosis Date  . Asthma   . Depression   . Hypertension     Past Surgical History:  Procedure Laterality Date  . DILATION AND  EVACUATION    . NO PAST SURGERIES      OB History    Gravida Para Term Preterm AB Living   5 2 2   3 2    SAB TAB Ectopic Multiple Live Births   1 2   0 2      No Known Allergies  Social History   Social History  . Marital status: Single    Spouse name: N/A  . Number of children: N/A  . Years of education: N/A   Social History Main Topics  . Smoking status: Former Smoker    Packs/day: 0.50    Years: 2.00    Types: Cigarettes  . Smokeless tobacco: Never Used  . Alcohol use No     Comment: not now  . Drug use: No  . Sexual activity: Yes    Birth control/ protection: None, Condom   Other Topics Concern  . None   Social History Narrative  . None    Family History  Problem Relation Age of Onset  . Depression Mother   . Depression Sister   . Other Brother     killed at age 26  . Hypertension Maternal Grandmother   . Hypertension Maternal Grandfather   . Stroke Maternal Grandfather

## 2017-03-23 ENCOUNTER — Encounter: Payer: Medicaid Other | Admitting: Women's Health

## 2017-03-29 ENCOUNTER — Other Ambulatory Visit: Payer: Self-pay | Admitting: Advanced Practice Midwife

## 2017-03-30 ENCOUNTER — Encounter: Payer: Medicaid Other | Admitting: Women's Health

## 2017-04-06 IMAGING — US US OB TRANSVAGINAL
1 series · 13 of 28 positions shown · non-contrast
Comparison: 02/11/2016

CLINICAL DATA: Right pelvic pain. Early pregnancy. Beta HCG level
[DATE].

EXAM:
OBSTETRIC <14 WK US AND TRANSVAGINAL OB US
TECHNIQUE: Both transabdominal and transvaginal ultrasound examinations were
performed for complete evaluation of the gestation as well as the
maternal uterus, adnexal regions, and pelvic cul-de-sac.
Transvaginal technique was performed to assess early pregnancy.

[Series 1: us ob transvaginal · 0.19mm/px · 13 of 118 slices shown]
[im 5/118]
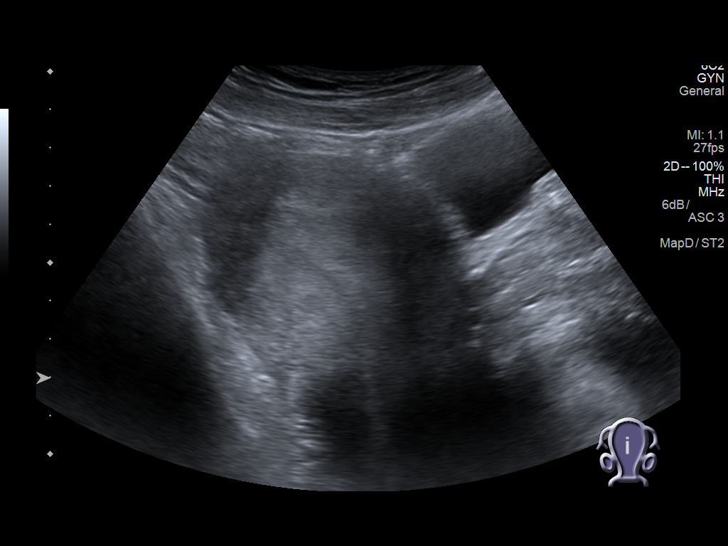
[im 14/118]
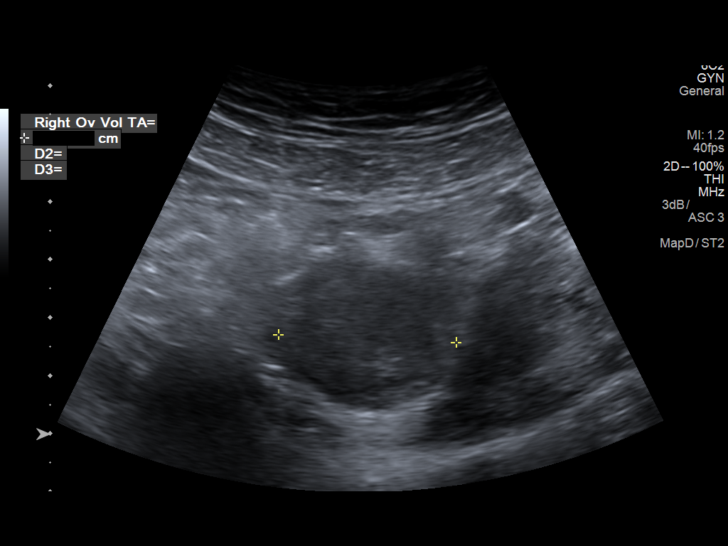
[im 22/118]
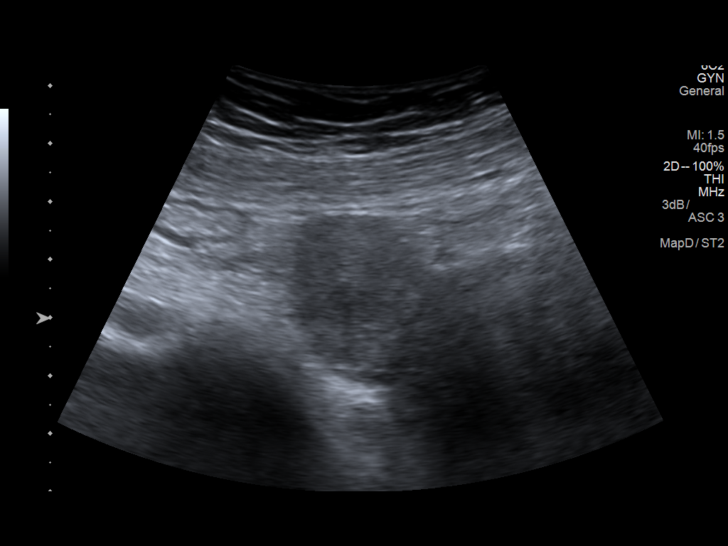
[im 31/118]
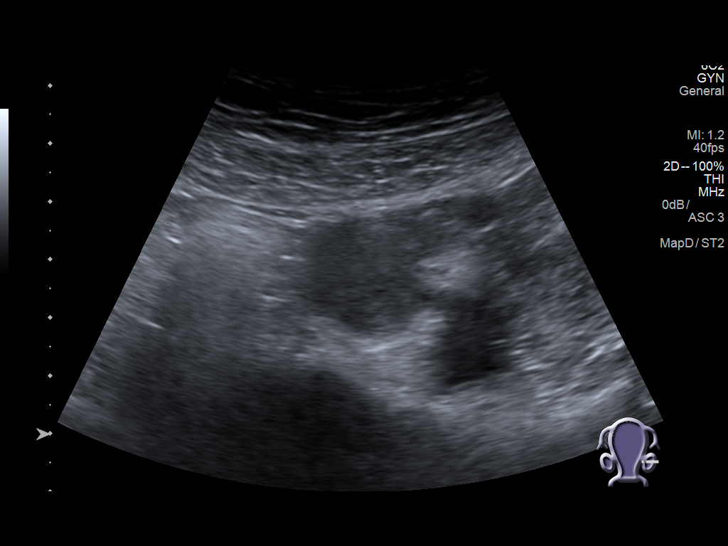
[im 40/118]
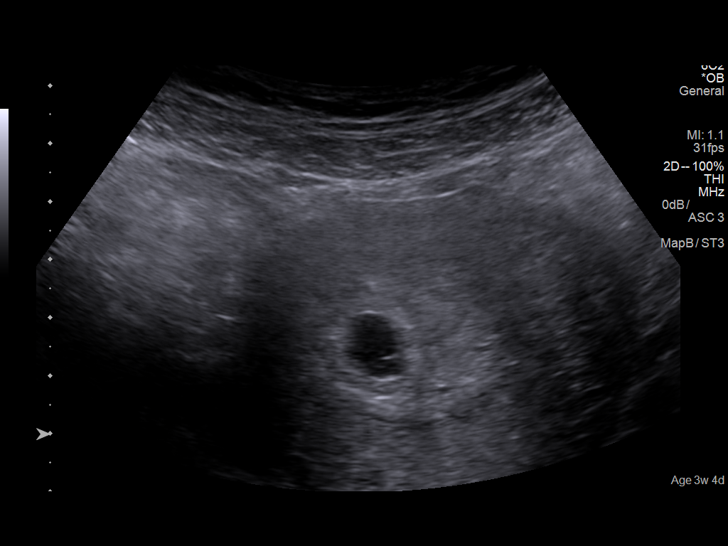
[im 48/118]
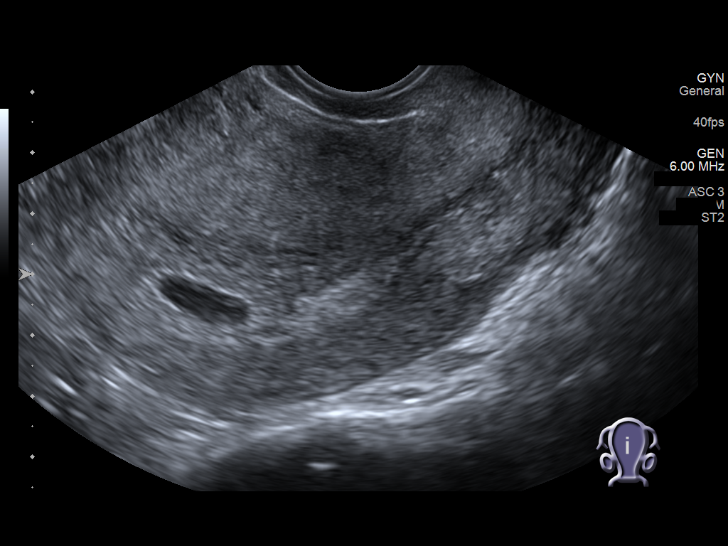
[im 61/118]
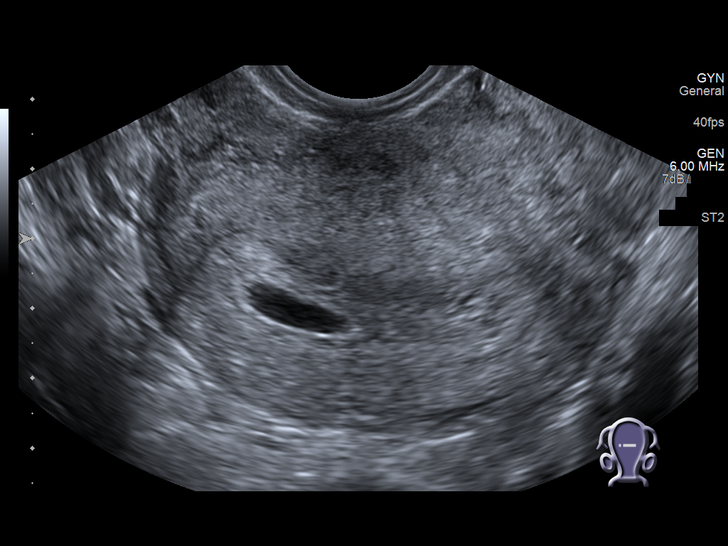
[im 70/118]
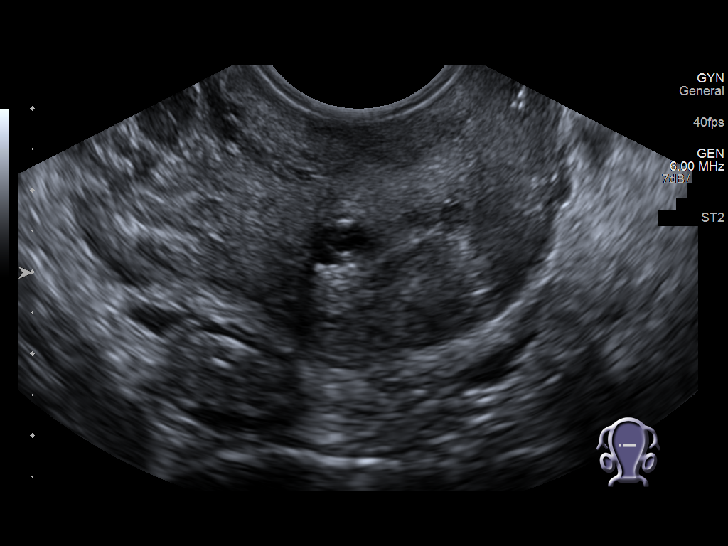
[im 79/118]
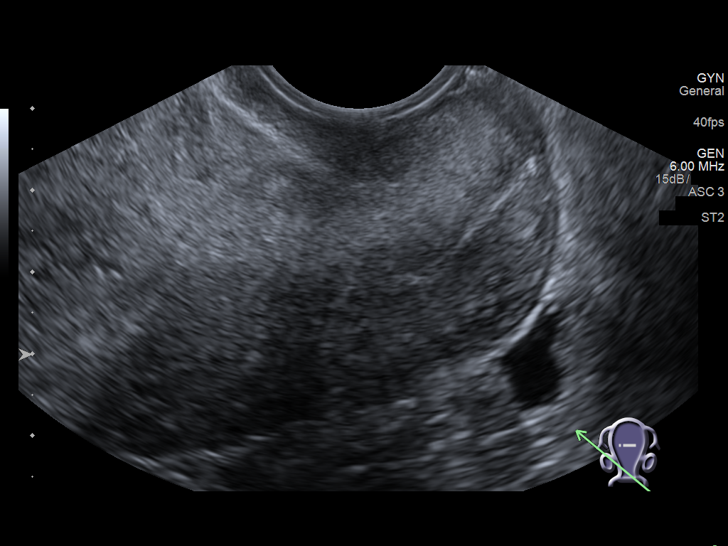
[im 87/118]
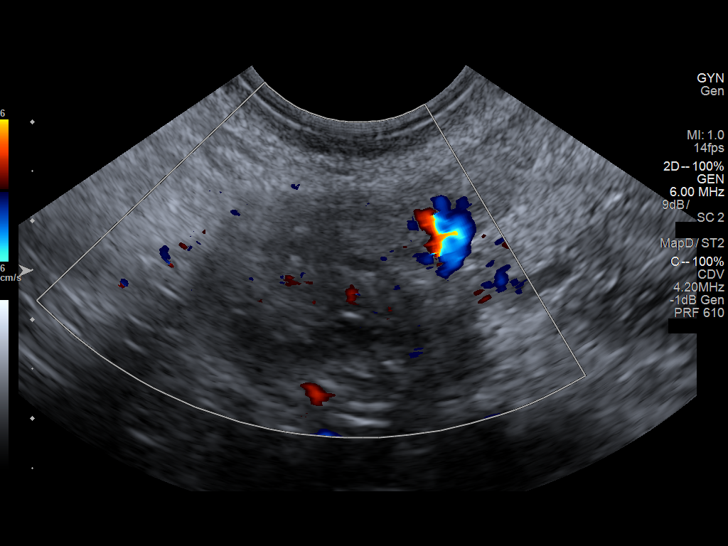
[im 96/118]
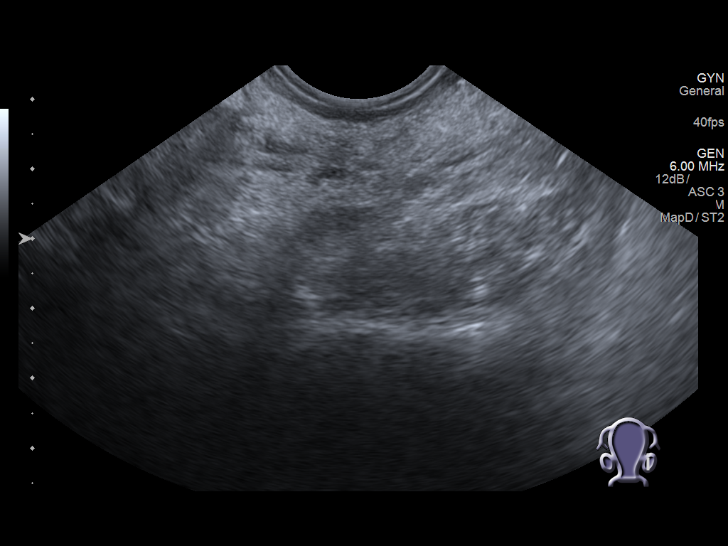
[im 105/118]
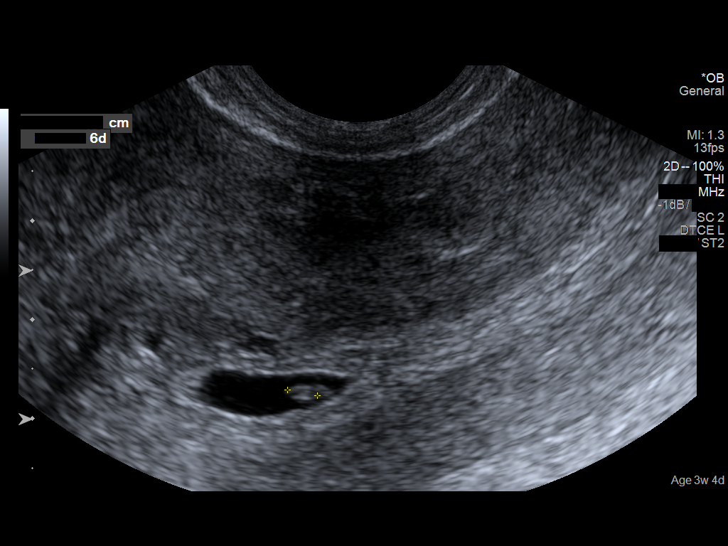
[im 113/118]
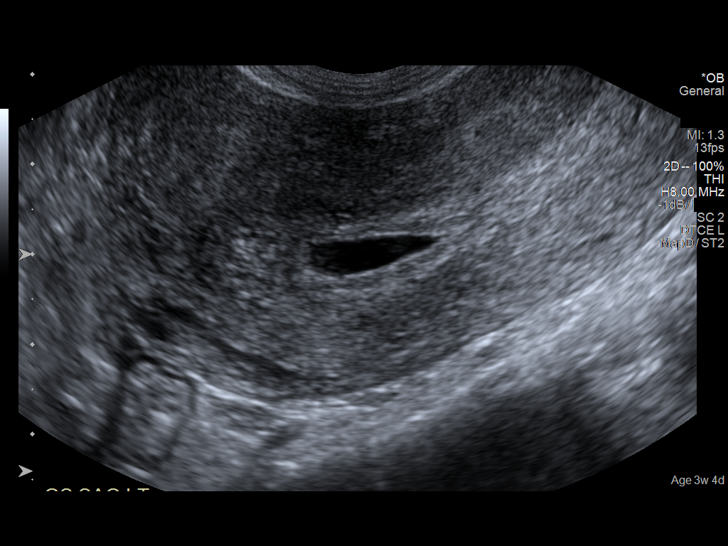

[13 of 28 positions shown; findings below may reference images not displayed]

FINDINGS: Intrauterine gestational sac: Present

Yolk sac:  Present

Embryo:  Present

Cardiac Activity: Visually suggested that difficult to measure
related to early stage of development.

Heart Rate: Could not be accurately measured.

MSD:   mm    w     d

CRL:  3  mm   5 w   6 d                  US EDC: 02/14/2017

Subchorionic hemorrhage:  None visualized.

Maternal uterus/adnexae: Ovaries unremarkable, right ovary
cubic cm and left ovary 13.0 cubic cm. Trace amount of fluid in the
posterior cul-de-sac. Possible left anterior fibroid 1 cm diameter.
IMPRESSION: 1. Single living intrauterine pregnancy measuring at 5 weeks 6 days
gestation. Heart rate could not be accurately measured primarily due
to small embryo size. No subchorionic hemorrhage. Trace amount of
free pelvic fluid in the cul-de-sac.
2. Possible left anterior uterine fibroid, 1 cm diameter.

## 2017-04-12 ENCOUNTER — Encounter: Payer: Medicaid Other | Admitting: Women's Health

## 2017-04-20 ENCOUNTER — Encounter: Payer: Self-pay | Admitting: Women's Health

## 2017-04-20 ENCOUNTER — Ambulatory Visit (INDEPENDENT_AMBULATORY_CARE_PROVIDER_SITE_OTHER): Payer: Medicaid Other | Admitting: Women's Health

## 2017-04-20 VITALS — BP 126/84 | HR 96 | Ht 65.0 in | Wt 189.2 lb

## 2017-04-20 DIAGNOSIS — Z3202 Encounter for pregnancy test, result negative: Secondary | ICD-10-CM

## 2017-04-20 DIAGNOSIS — Z308 Encounter for other contraceptive management: Secondary | ICD-10-CM | POA: Diagnosis not present

## 2017-04-20 DIAGNOSIS — Z30017 Encounter for initial prescription of implantable subdermal contraceptive: Secondary | ICD-10-CM | POA: Insufficient documentation

## 2017-04-20 LAB — POCT URINE PREGNANCY: PREG TEST UR: NEGATIVE

## 2017-04-20 NOTE — Patient Instructions (Signed)

## 2017-04-20 NOTE — Progress Notes (Signed)
Deborah Klein is a 26 y.o. year old Caucasian female here for Nexplanon insertion.  Patient's last menstrual period was 03/21/2017., last sexual intercourse was >3wks ago, and her pregnancy test today was negative.  Risks/benefits/side effects of Nexplanon have been discussed and her questions have been answered.  Specifically, a failure rate of 12/998 has been reported, with an increased failure rate if pt takes St. John's Wort and/or antiseizure medicaitons.  Avilene Marrin is aware of the common side effect of irregular bleeding, which the incidence of decreases over time.  BP 126/84 (BP Location: Right Arm, Patient Position: Sitting, Cuff Size: Normal)   Pulse 96   Ht  (1.651 m)   Wt 189 lb 3.2 oz (85.8 kg)   LMP 03/21/2017   Breastfeeding? No   BMI 31.48 kg/m   Results for orders placed or performed in visit on 04/20/17 (from the past 24 hour(s))  POCT urine pregnancy   Collection Time: 04/20/17  2:25 PM  Result Value Ref Range   Preg Test, Ur Negative Negative     She is right-handed, so her left arm, approximately 4 inches proximal from the elbow, was cleansed with alcohol and anesthetized with 2cc of 2% Lidocaine.  The area was cleansed again with betadine and the Nexplanon was inserted per manufacturer's recommendations without difficulty.  3 steri-strips and pressure bandage were applied.  Pt was instructed to keep the area clean and dry, remove pressure bandage in 24 hours, and keep insertion site covered with the steri-strip for 3-5 days.  Back up contraception was recommended for 2 weeks.  She was given a card indicating date Nexplanon was inserted and date it needs to be removed. Follow-up PRN problems, then in July for physical.  BP good, can stay off of norvasc for now, will recheck at visit in July  Marge Duncans CNM, [facility] 04/20/2017 2:35 PM

## 2017-06-12 IMAGING — US US OB COMP LESS 14 WK
1 series · 15 of 28 positions shown · non-contrast
Comparison: None.

CLINICAL DATA: Abdominal pain in first-trimester pregnancy

EXAM:
OBSTETRIC <14 WK US AND TRANSVAGINAL OB US
TECHNIQUE: Both transabdominal and transvaginal ultrasound examinations were
performed for complete evaluation of the gestation as well as the
maternal uterus, adnexal regions, and pelvic cul-de-sac.
Transvaginal technique was performed to assess early pregnancy.

[Series 1: us ob comp less 14 wk · 15 of 82 slices shown]
[im 1/82]
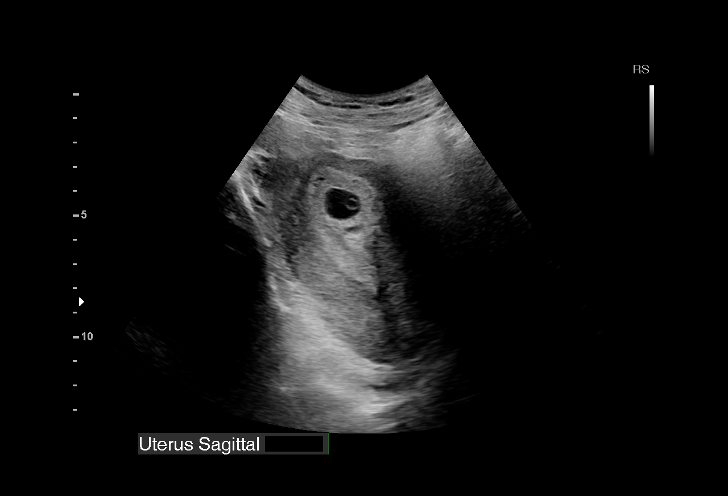
[im 7/82]
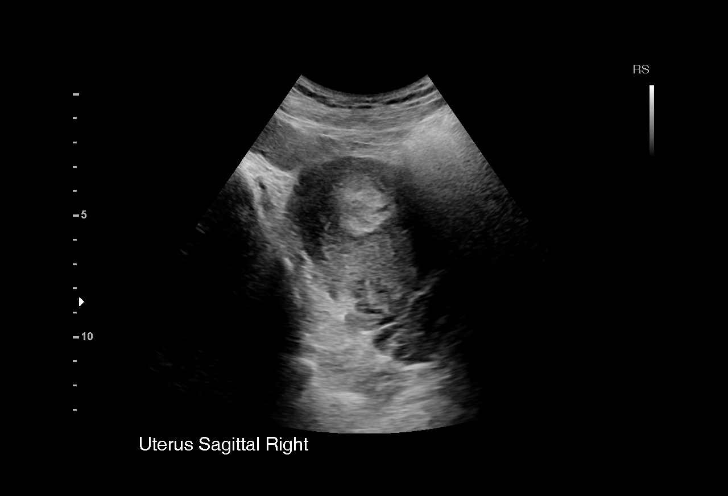
[im 13/82]
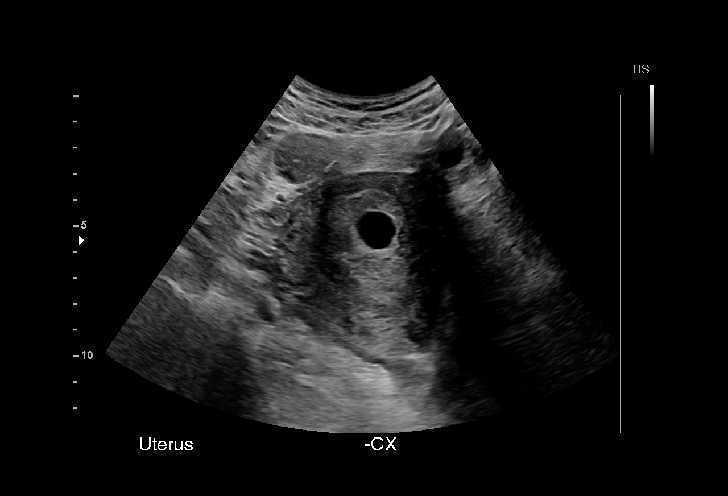
[im 19/82]
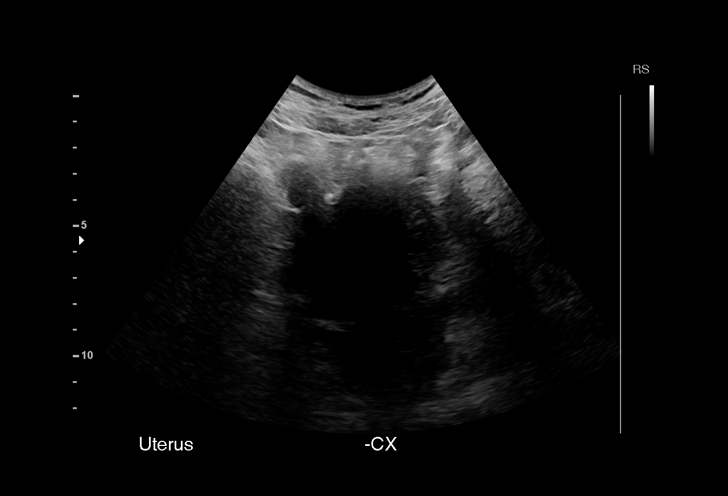
[im 25/82]
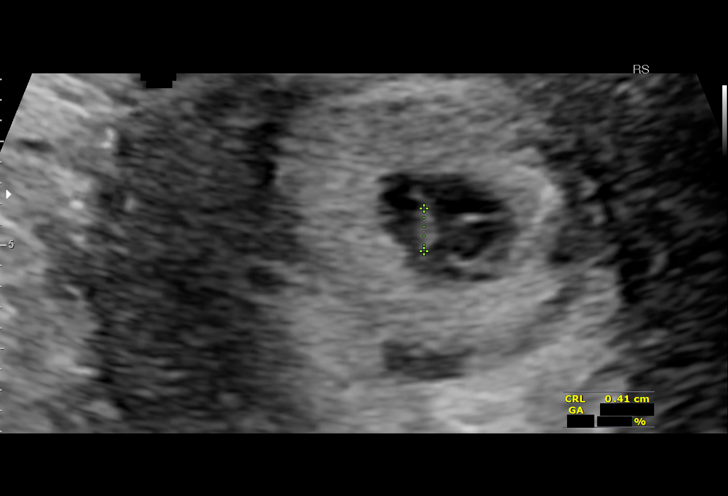
[im 31/82]
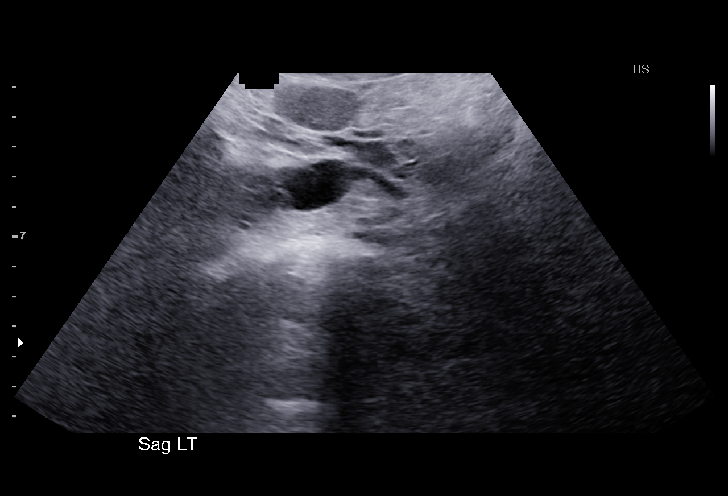
[im 37/82]
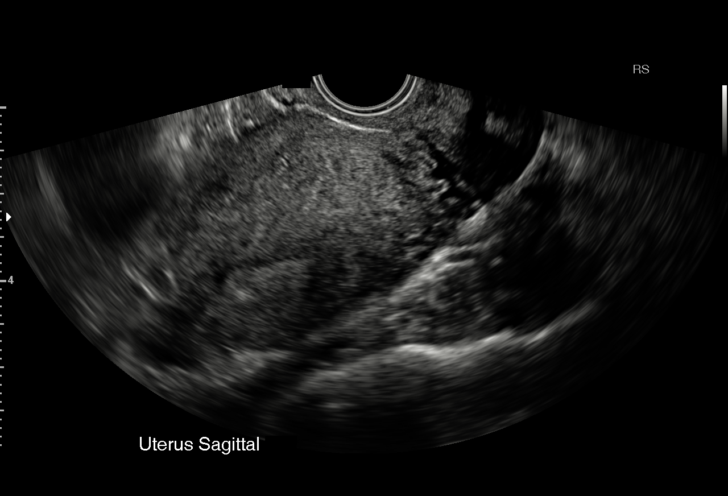
[im 43/82]
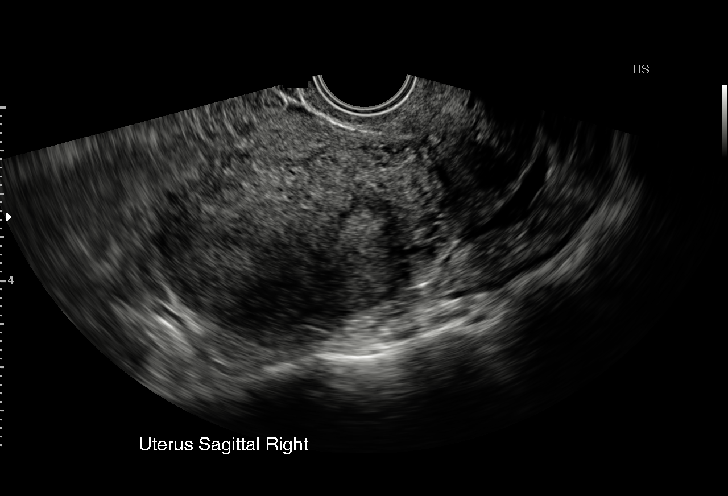
[im 46/82]
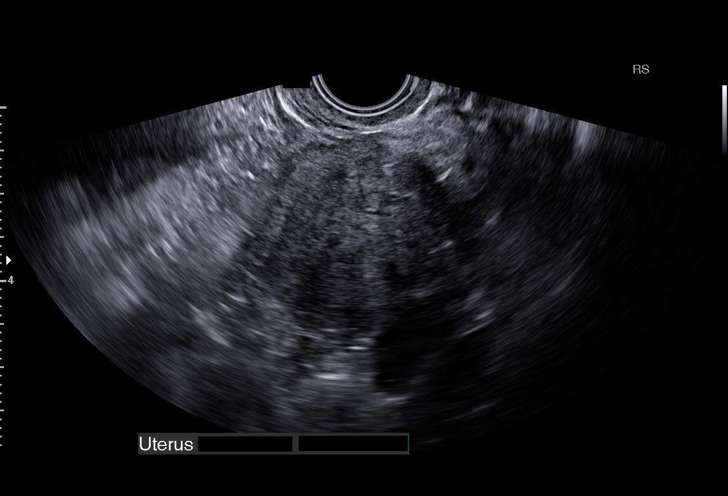
[im 52/82]
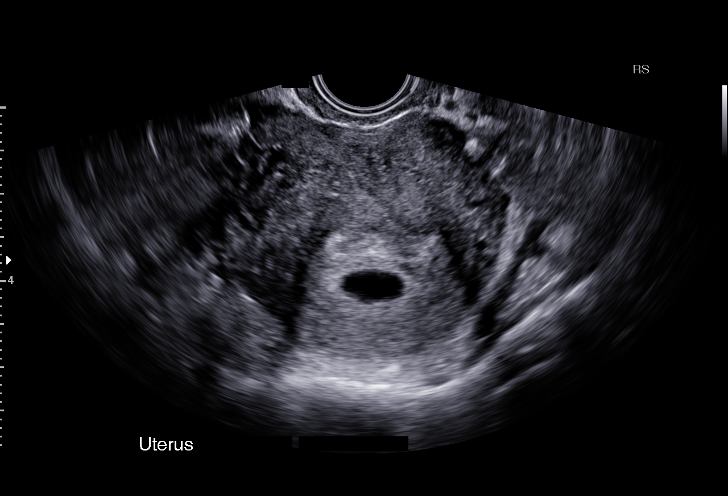
[im 58/82]
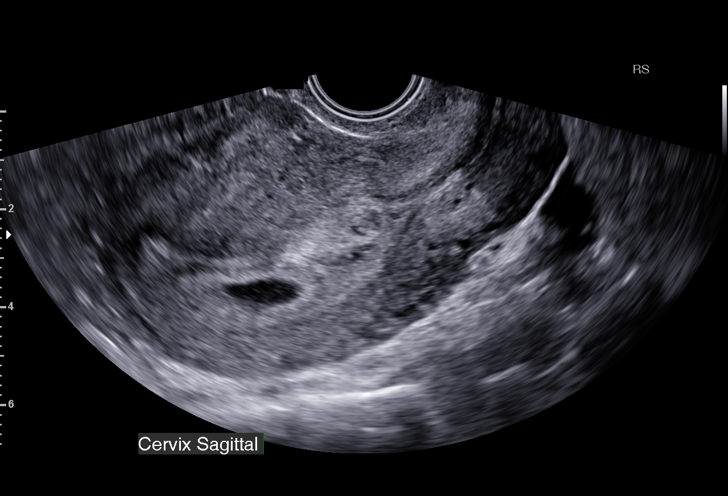
[im 64/82]
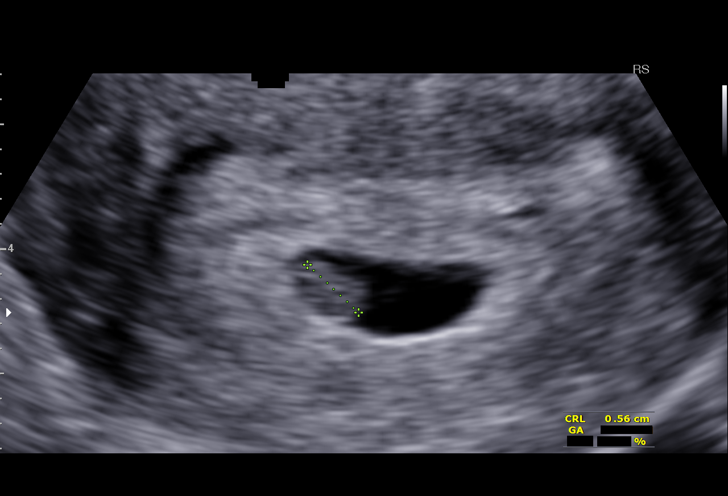
[im 70/82]
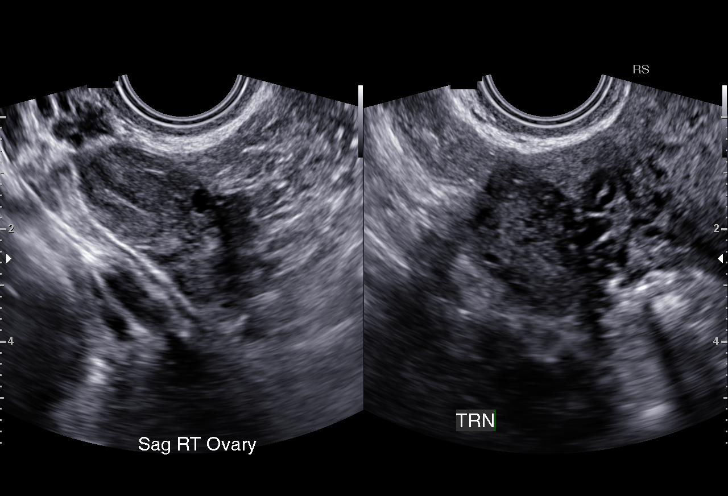
[im 76/82]
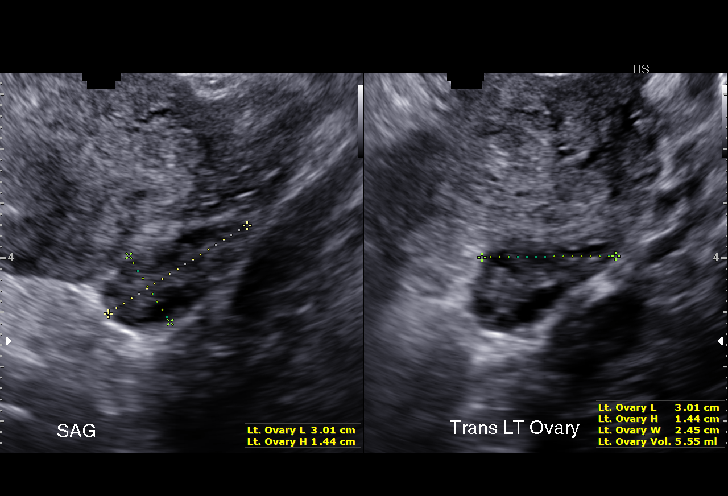
[im 82/82]
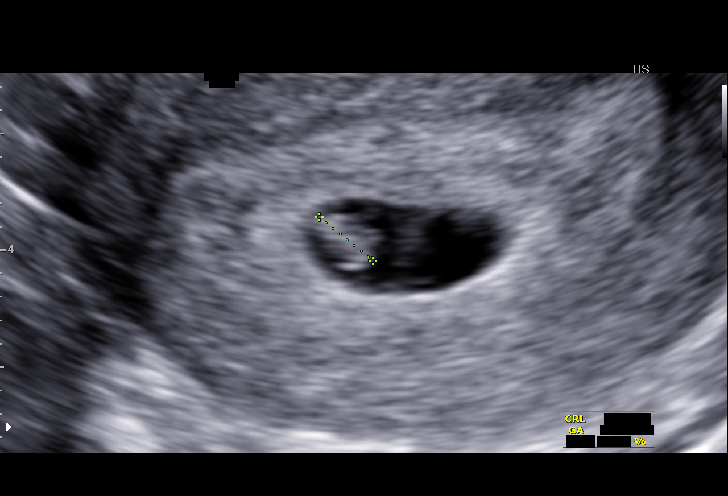

[15 of 28 positions shown; findings below may reference images not displayed]

FINDINGS: Intrauterine gestational sac: Visualized/normal in shape.

Yolk sac:  Present

Embryo:  Present

Cardiac Activity: Present

Heart Rate: 122  bpm

CRL:  6  mm   6 w   3 d                  US EDC: 10/03/2016

Subchorionic hemorrhage:  Present inferiorly, 19 x 6 x 32 mm

Maternal uterus/adnexae: 12 mm intramural fibroid noted, closest to
the serosal surface. Corpus luteum on the right.
IMPRESSION: 1. Single living intrauterine pregnancy measuring 6 weeks 3 days.
2. 6 x 19 x 32 mm subchorionic hematoma.
3. 12 mm intramural fibroid.

## 2017-06-13 ENCOUNTER — Emergency Department (HOSPITAL_COMMUNITY)
Admission: EM | Admit: 2017-06-13 | Discharge: 2017-06-13 | Disposition: A | Discharge status: Home / Self Care | Payer: Medicaid Other | Attending: Emergency Medicine | Admitting: Emergency Medicine

## 2017-06-13 ENCOUNTER — Encounter (HOSPITAL_COMMUNITY): Payer: Self-pay | Admitting: Emergency Medicine

## 2017-06-13 DIAGNOSIS — W57XXXA Bitten or stung by nonvenomous insect and other nonvenomous arthropods, initial encounter: Secondary | ICD-10-CM | POA: Insufficient documentation

## 2017-06-13 DIAGNOSIS — J45909 Unspecified asthma, uncomplicated: Secondary | ICD-10-CM | POA: Insufficient documentation

## 2017-06-13 DIAGNOSIS — R42 Dizziness and giddiness: Secondary | ICD-10-CM

## 2017-06-13 DIAGNOSIS — Z720 Tobacco use: Secondary | ICD-10-CM | POA: Insufficient documentation

## 2017-06-13 DIAGNOSIS — M791 Myalgia: Secondary | ICD-10-CM | POA: Insufficient documentation

## 2017-06-13 DIAGNOSIS — R5383 Other fatigue: Secondary | ICD-10-CM | POA: Insufficient documentation

## 2017-06-13 DIAGNOSIS — I10 Essential (primary) hypertension: Secondary | ICD-10-CM | POA: Insufficient documentation

## 2017-06-13 NOTE — ED Provider Notes (Signed)
[facility]-EMERGENCY DEPT Provider Note   CSN: [id number] Arrival date & time: 06/13/17  1109     History   Chief Complaint Chief Complaint  Patient presents with  . Insect Bite    HPI Deborah Klein is a 26 y.o. female.  Patient is a 26 year old female who presents to the emergency department because of fatigue, and lower extremity numbness at times.  The patient states that about 3 days ago she removed a tick from her hip area. She states since that time she has been feeling as though she has more fatigue than usual. She has been having some intermittent numbness of both lower extremities and generally not feeling well. She is also had a few episodes of diarrhea. There's been no fever reported. No recent changes in medications. Patient has not been out of the country. No other illnesses reported. It is of note that the patient has a Nexplanon implant that she thinks may be broken. She states that she has almost continuous menstrual cycles. She has some lightheadedness from time to time. She has not had her blood work checked recently. She states that her times her eating habits are not the best, and she has not been keeping up with her fluids. She has not had any fall or injury to her back or legs that should contribute to the numb sensation on that she feels. No unusual rash or fever reported. The tingling and numbness has not interfered with her activities of daily living. She presents now for evaluation of these symptoms and the tick bite.   The history is provided by the patient.    Past Medical History:  Diagnosis Date  . Asthma   . Depression   . Hypertension     Patient Active Problem List   Diagnosis Date Noted  . Nexplanon insertion 04/20/2017  . Chronic hypertension 03/01/2017  . Marijuana use 11/21/2016  . History of gestational diabetes 10/20/2016  . Asthma   . Depression     Past Surgical History:  Procedure Laterality Date  . DILATION AND EVACUATION    . NO  PAST SURGERIES      OB History    Gravida Para Term Preterm AB Living   5 2 2   3 2    SAB TAB Ectopic Multiple Live Births   1 2   0 2       Home Medications    Prior to Admission medications   Medication Sig Start Date End Date Taking? Authorizing Provider  amLODipine (NORVASC) 10 MG tablet Take 1 tablet (10 mg total) by mouth daily. Patient not taking: Reported on 03/01/2017 01/28/17   Arabella MerlesShaw, Kimberly D, CNM    Family History Family History  Problem Relation Age of Onset  . Depression Mother   . Depression Sister   . Other Brother        killed at age 26  . Hypertension Maternal Grandmother   . Hypertension Maternal Grandfather   . Stroke Maternal Grandfather     Social History Social History  Substance Use Topics  . Smoking status: Light Tobacco Smoker    Packs/day: 0.03    Years: 2.00    Types: Cigarettes  . Smokeless tobacco: Never Used  . Alcohol use No     Comment: social     Allergies   Patient has no known allergies.   Review of Systems Review of Systems  Constitutional: Positive for fatigue. Negative for activity change and fever.  All ROS Neg except as noted in HPI  HENT: Negative for nosebleeds.   Eyes: Negative for photophobia and discharge.  Respiratory: Negative for cough, shortness of breath and wheezing.   Cardiovascular: Negative for chest pain and palpitations.  Gastrointestinal: Negative for abdominal pain and blood in stool.  Genitourinary: Negative for dysuria, frequency and hematuria.  Musculoskeletal: Positive for myalgias. Negative for arthralgias, back pain and neck pain.  Skin: Negative.   Neurological: Positive for light-headedness and numbness. Negative for dizziness, seizures and speech difficulty.  Psychiatric/Behavioral: Negative for confusion and hallucinations.  All other systems reviewed and are negative.    Physical Exam Updated Vital Signs BP (!) 152/98 (BP Location: Right Arm)   Pulse 72   Temp 98.4 F (36.9  C) (Oral)   Resp 18   Ht 5\' 5"  (1.651 m)   Wt 77.1 kg (170 lb)   LMP 02/23/2017   SpO2 99%   Breastfeeding? No   BMI 28.29 kg/m   Physical Exam  Constitutional: Vital signs are normal. She appears well-developed and well-nourished. She is active.  HENT:  Head: Normocephalic and atraumatic.  Right Ear: Tympanic membrane, external ear and ear canal normal.  Left Ear: Tympanic membrane, external ear and ear canal normal.  Nose: Nose normal.  Mouth/Throat: Uvula is midline, oropharynx is clear and moist and mucous membranes are normal.  Eyes: Conjunctivae, EOM and lids are normal. Pupils are equal, round, and reactive to light.  Neck: Trachea normal, normal range of motion and phonation normal. Neck supple. Carotid bruit is not present.  Cardiovascular: Normal rate, regular rhythm and normal pulses.   Pulmonary/Chest: Effort normal and breath sounds normal. She has no wheezes.  Abdominal: Soft. Normal appearance and bowel sounds are normal.  Musculoskeletal: Normal range of motion. She exhibits no edema or tenderness.  Negative Homans signs  Lymphadenopathy:       Head (right side): No submental, no preauricular and no posterior auricular adenopathy present.       Head (left side): No submental, no preauricular and no posterior auricular adenopathy present.    She has no cervical adenopathy.  Neurological: She is alert. She has normal strength. No cranial nerve deficit or sensory deficit. Coordination normal. GCS eye subscore is 4. GCS verbal subscore is 5. GCS motor subscore is 6.  Gait is steady. No changes or problems balance.  Skin: Skin is warm and dry. Capillary refill takes less than 2 seconds. No rash noted. No pallor.  Psychiatric: Her speech is normal.     ED Treatments / Results  Labs (all labs ordered are listed, but only abnormal results are displayed) Labs Reviewed - No data to display  EKG  EKG Interpretation None       Radiology No results  found.  Procedures Procedures (including critical care time)  Medications Ordered in ED Medications - No data to display   Initial Impression / Assessment and Plan / ED Course  I have reviewed the triage vital signs and the nursing notes.  Pertinent labs & imaging results that were available during my care of the patient were reviewed by me and considered in my medical decision making (see chart for details).      Final Clinical Impressions(s) / ED Diagnoses MDM The blood pressure is elevated at 152/98, otherwise vital signs within normal limits. The pulse oximetry is 99% on room air. Within normal limits by my interpretation. There no gross neurovascular changes on the examination. At this point is no evidence of  any tickborne illness he. I discussed with the patient that this could be related to a viral illness that is transient. He could also be related to the use of her birth control implant, and or the extended menstrual cycles that she has been experiencing. I offered to check a complete blood count and chemistries on the patient . After coming out of the room, I was advised by the nursing staff that the patient left before orders could be written. She states that she had to pick up her children on. She promises that she will follow-up with her primary physician.    Final diagnoses:  Tick bite, initial encounter  Lightheadedness    New Prescriptions New Prescriptions   No medications on file     Ivery Quale, Cordelia Poche 06/14/17 1610    Benjiman Core, MD 06/14/17 1537

## 2017-06-13 NOTE — ED Triage Notes (Signed)
Pt reports removed small tick from right hip on Saturday night. Pt reports ever since has had fatigue, intermittent diarrhea and BLE numbness, and generalized malaise.

## 2017-06-13 NOTE — ED Notes (Signed)
Patient came to writers desk and states she is going to leave to pick up her children. Offered patient to speak with provider prior to leaving, patient stated she needed to leave. Informed patient to follow up with PCP. Steady gait noted while leaving dept.

## 2017-09-25 ENCOUNTER — Telehealth: Payer: Self-pay | Admitting: Women's Health

## 2017-09-25 NOTE — Telephone Encounter (Signed)
LMOVM returning call 

## 2017-11-22 ENCOUNTER — Telehealth: Payer: Self-pay | Admitting: Adult Health

## 2017-11-22 NOTE — Telephone Encounter (Signed)
Patient states she has been having bleeding since her Nexplanon was placed in April. States it will go off for a day then come back on. It is now the lightest it has been with mainly only spotting. Please advise.

## 2017-11-22 NOTE — Telephone Encounter (Signed)
Patient called stating that she got the Nexplanon inserted in April and she has not stopped having a period, pt would like to know if that is normal. Please give her a call back.

## 2017-11-23 NOTE — Telephone Encounter (Signed)
Informed patient that if she's having any abnormal discharge she can come in for wet prep, gc/ct. Otherwise, if is only spotting now and much lighter- continue to monitor.  Patient states bleeding is lighter but will monitor. Informed if it gets consistently heavier can try megace. Pt verbalized understanding.

## 2018-01-04 ENCOUNTER — Telehealth: Payer: Self-pay | Admitting: *Deleted

## 2018-01-04 MED ORDER — MEGESTROL ACETATE 40 MG PO TABS: ORAL_TABLET | ORAL | 1 refills | Status: DC

## 2018-01-04 NOTE — Telephone Encounter (Signed)
Patient called stating she is still having some spotting or bleeding everyday.  Wants to try Megace before having removed if possible.  PLease advise.

## 2018-01-04 NOTE — Telephone Encounter (Signed)
Pt called w/ reports of bleeding daily w/ Nexplanon, placed in April, wants Megace. Megace Rx sent. Let us know if not improving.  Cheral MarkerKimberly R. Deloy Archey, CNM, [facility]NP-BC 01/04/2018 12:29 PM

## 2018-01-04 NOTE — Telephone Encounter (Signed)
LMOVM that Megace was sent to pharmacy.  

## 2018-01-08 ENCOUNTER — Telehealth: Payer: Self-pay | Admitting: *Deleted

## 2018-01-08 NOTE — Telephone Encounter (Signed)
Informed patient she could try  Ibuprofen 600mg  one time daily x 5 days per month (no more than 5 days per month)   No further questions. Verbalized understanding.

## 2018-01-17 ENCOUNTER — Encounter: Payer: Self-pay | Admitting: Advanced Practice Midwife

## 2018-01-17 ENCOUNTER — Telehealth: Payer: Self-pay | Admitting: Advanced Practice Midwife

## 2018-01-17 ENCOUNTER — Ambulatory Visit (INDEPENDENT_AMBULATORY_CARE_PROVIDER_SITE_OTHER): Payer: Medicaid Other | Admitting: Advanced Practice Midwife

## 2018-01-17 VITALS — BP 150/100 | HR 97 | Ht 65.0 in | Wt 188.0 lb

## 2018-01-17 DIAGNOSIS — Z309 Encounter for contraceptive management, unspecified: Secondary | ICD-10-CM

## 2018-01-17 DIAGNOSIS — Z01419 Encounter for gynecological examination (general) (routine) without abnormal findings: Secondary | ICD-10-CM

## 2018-01-17 MED ORDER — MEGESTROL ACETATE 40 MG PO TABS: ORAL_TABLET | ORAL | 3 refills | Status: DC

## 2018-01-17 MED ORDER — AMLODIPINE BESYLATE 10 MG PO TABS: 10.0000 mg | ORAL_TABLET | Freq: Every day | ORAL | 11 refills | Status: DC

## 2018-01-17 NOTE — Progress Notes (Signed)
Deborah CaiAlexandria Klein 27 y.o.  Vitals:   01/17/18 1148  BP: (!) 150/100  Pulse: 97     Filed Weights   01/17/18 1148  Weight: 188 lb (85.3 kg)    Past Medical History: Past Medical History:  Diagnosis Date  . Asthma   . Depression   . Hypertension     Past Surgical History: Past Surgical History:  Procedure Laterality Date  . DILATION AND EVACUATION    . NO PAST SURGERIES      Family History: Family History  Problem Relation Age of Onset  . Depression Mother   . Depression Sister   . Other Brother        killed at age 27  . Hypertension Maternal Grandmother   . Hypertension Maternal Grandfather   . Stroke Maternal Grandfather     Social History: Social History   Tobacco Use  . Smoking status: Light Tobacco Smoker    Packs/day: 0.03    Years: 2.00    Pack years: 0.06    Types: Cigarettes  . Smokeless tobacco: Never Used  Substance Use Topics  . Alcohol use: No    Comment: social  . Drug use: No    Allergies: No Known Allergies    Current Outpatient Medications:  .  amLODipine (NORVASC) 10 MG tablet, Take 1 tablet (10 mg total) by mouth daily. (Patient not taking: Reported on 03/01/2017), Disp: 60 tablet, Rfl: 1 .  megestrol (MEGACE) 40 MG tablet, 3x5d, 2x5d, then 1 daily to help control vaginal bleeding. Stop taking when bleeding stops. (Patient not taking: Reported on 01/17/2018), Disp: 45 tablet, Rfl: 1  History of Present Illness: here for Family Plannng physical. Originally scheduled for Nexplanon removal d/t BTB. But never took megace (or Norvasc, for that matter).  Aware that she needs norvasc, wants to try the megace first . No ins for meds, so rx sent [facility] Normal pap in 2017.   Review of Systems   Patient denies any headaches, blurred vision, shortness of breath, chest pain, abdominal pain, problems with bowel movements, urination, or intercourse.   Physical Exam: General:  Well developed, well nourished, no acute distress Skin:  Warm  and dry Neck:  Midline trachea, normal thyroid Lungs; Clear to auscultation bilaterally Breast:  No dominant palpable mass, retraction, or nipple discharge Cardiovascular: Regular rate and rhythm Abdomen:  Soft, non tender, no hepatosplenomegaly Pelvic:  External genitalia is normal in appearance.  The vagina is normal in appearance.  The cervix is bulbous.  Uterus is felt to be normal size, shape, and contour.  No adnexal masses or tenderness noted.  Extremities:  No swelling or varicosities noted Psych:  No mood changes.     Impression: normal physical     Plan: try megace. Start norvasc.

## 2018-01-17 NOTE — Telephone Encounter (Signed)
LMOVM that [facility] [place] which should be the cheapest place for her since insurance will not pay. Advised pt to [facility] price at [facility].

## 2018-05-08 ENCOUNTER — Telehealth: Payer: Self-pay | Admitting: *Deleted

## 2018-05-08 NOTE — Telephone Encounter (Signed)
Spoke with pt. Pt is requesting a refill on Megace. Thanks!! JSY 

## 2018-05-09 ENCOUNTER — Other Ambulatory Visit: Payer: Self-pay | Admitting: Advanced Practice Midwife

## 2018-05-09 MED ORDER — MEGESTROL ACETATE 40 MG PO TABS: ORAL_TABLET | ORAL | 3 refills | Status: DC

## 2018-05-22 ENCOUNTER — Emergency Department (HOSPITAL_COMMUNITY)
Admission: EM | Admit: 2018-05-22 | Discharge: 2018-05-22 | Disposition: A | Discharge status: Home / Self Care | Payer: Self-pay | Attending: Emergency Medicine | Admitting: Emergency Medicine

## 2018-05-22 ENCOUNTER — Encounter (HOSPITAL_COMMUNITY): Payer: Self-pay | Admitting: Emergency Medicine

## 2018-05-22 ENCOUNTER — Other Ambulatory Visit: Payer: Self-pay

## 2018-05-22 ENCOUNTER — Telehealth: Payer: Self-pay | Admitting: Obstetrics & Gynecology

## 2018-05-22 DIAGNOSIS — I10 Essential (primary) hypertension: Secondary | ICD-10-CM | POA: Insufficient documentation

## 2018-05-22 DIAGNOSIS — F329 Major depressive disorder, single episode, unspecified: Secondary | ICD-10-CM | POA: Insufficient documentation

## 2018-05-22 DIAGNOSIS — J45909 Unspecified asthma, uncomplicated: Secondary | ICD-10-CM | POA: Insufficient documentation

## 2018-05-22 DIAGNOSIS — Z79899 Other long term (current) drug therapy: Secondary | ICD-10-CM | POA: Insufficient documentation

## 2018-05-22 DIAGNOSIS — N909 Noninflammatory disorder of vulva and perineum, unspecified: Secondary | ICD-10-CM | POA: Insufficient documentation

## 2018-05-22 DIAGNOSIS — F17210 Nicotine dependence, cigarettes, uncomplicated: Secondary | ICD-10-CM | POA: Insufficient documentation

## 2018-05-22 DIAGNOSIS — N9089 Other specified noninflammatory disorders of vulva and perineum: Secondary | ICD-10-CM

## 2018-05-22 MED ORDER — LIDOCAINE HCL URETHRAL/MUCOSAL 2 % EX GEL
1.0000 "application " | Freq: Once | CUTANEOUS | Status: AC
  Administered 2018-05-22: 1 via TOPICAL
  Filled 2018-05-22: qty 10

## 2018-05-22 MED ORDER — LIDOCAINE 5 % EX OINT: 1.0000 "application " | TOPICAL_OINTMENT | CUTANEOUS | 0 refills | Status: DC | PRN

## 2018-05-22 NOTE — ED Notes (Signed)
Pt c/o possible abscess or STD to right labia minora x couple of days. Pt reports she is concerned this could be a STD due to her boyfriend "cheating on me over a year ago". Pt very tearful during RN assessment. Pt reports hx of depression and being on medications at one time but she didn't take them very long because they "made her feel bad". Pt reports she has been experiencing extreme sadness and feelings of depression "for awhile now". Pt reports feeling overwhelmed and consumed with feelings of guilt because of this. She has 2 children, ages 41 months and 9 years ago and says she "tries to be positive for them, but it's getting really hard". Pt reports she knows she needs help but doesn't have any insurance at this time (pt has another 30 days to wait before her company insurance starts). Pt states, "I just don't know if I'm going to make it another 30 days until my insurance kicks in for me to get help". Pt denies SI/HI at this time. Pt denies support system. She reports she "bottles up" her feelings and then explodes "on people that don't deserve it". Pt given emotional support.

## 2018-05-22 NOTE — Discharge Instructions (Signed)
As discussed, it is important that he follow-up for both of your issues that we discussed today. In regards to your skin issue, please use Epson salt baths, 3 times daily, and to the provided medication, which should be applied after the pads. If the lesion does not improve, please be sure to follow-up with our women's health colleagues.  Return here for concerning changes in your condition.

## 2018-05-22 NOTE — Telephone Encounter (Signed)
Called patient back but states she is in the ED.  Has a bump in her private area.  Advised to let us know if she needs anything else.

## 2018-05-22 NOTE — ED Triage Notes (Addendum)
Pt states she has had a "hair bumP" to vagina x 5 days. Pt tearful in triage. States she is scared and thinks it may be an STD.states feels like "everything is piling up" States she is depressed and feels like she may have postpartum depression and like she wants to "give up and just roll up into a ball".  Pt denies si/hi/avh.

## 2018-05-22 NOTE — ED Notes (Signed)
Pt given discharge instructions via teach back method. Pt verbalized understanding. RN forget to get pt to sign e-signature pad prior to leaving.

## 2018-05-22 NOTE — ED Provider Notes (Addendum)
[facility] EMERGENCY DEPARTMENT Provider Note   CSN: [id number] Arrival date & time: 05/22/18  1015     History   Chief Complaint Chief Complaint  Patient presents with  . Abscess    depressed    HPI Deborah Klein is a 27 y.o. female.  HPI This patient presents with concern of a skin lesion on her vulva, and depression. Patient is 16 months status post delivery of a baby. She notes that she is generally healthy, acknowledges a history of hypertension, has had depression. She notes that for some time she has had persistent depression, with no suicidal relation, specifying that she would never kill herself, but acknowledging that if she were to die she would not be disappointed. She does not take any medication for depression, has not seen any practitioners in a long time. Over the past 3 or 4 days she has also noticed a small lesion, painful, tender to palpation in the right inferior labia majora. No fever, no chills, no nausea, no vomiting, no diarrhea, no breast-feeding. No medication taken for relief of the skin lesion, nor any other interventions. Past Medical History:  Diagnosis Date  . Asthma   . Depression   . Hypertension     Patient Active Problem List   Diagnosis Date Noted  . Nexplanon insertion 04/20/2017  . Chronic hypertension 03/01/2017  . Marijuana use 11/21/2016  . History of gestational diabetes 10/20/2016  . Asthma   . Depression     Past Surgical History:  Procedure Laterality Date  . DILATION AND EVACUATION    . NO PAST SURGERIES       OB History    Gravida  5   Para  2   Term  2   Preterm      AB  3   Living  2     SAB  1   TAB  2   Ectopic      Multiple  0   Live Births  2            Home Medications    Prior to Admission medications   Medication Sig Start Date End Date Taking? Authorizing Provider  albuterol (PROVENTIL HFA;VENTOLIN HFA) 108 (90 Base) MCG/ACT inhaler Inhale 1 puff into the lungs every 6  (six) hours as needed for wheezing or shortness of breath.   Yes [provider]  etonogestrel (NEXPLANON) 68 MG IMPL implant 1 each by Subdermal route once.   Yes [provider]  megestrol (MEGACE) 40 MG tablet 3x5d, 2x5d, then 1 daily to help control vaginal bleeding. Stop taking when bleeding stops. 01/04/18  Yes Cheral Marker, CNM    Family History Family History  Problem Relation Age of Onset  . Depression Mother   . Depression Sister   . Other Brother        killed at age 77  . Hypertension Maternal Grandmother   . Hypertension Maternal Grandfather   . Stroke Maternal Grandfather     Social History Social History   Tobacco Use  . Smoking status: Light Tobacco Smoker    Packs/day: 0.03    Years: 2.00    Pack years: 0.06    Types: Cigarettes  . Smokeless tobacco: Never Used  Substance Use Topics  . Alcohol use: No    Comment: social  . Drug use: No     Allergies   Patient has no known allergies.   Review of Systems Review of Systems  Constitutional:  Per HPI, otherwise negative  HENT:       Per HPI, otherwise negative  Respiratory:       Per HPI, otherwise negative  Cardiovascular:       Per HPI, otherwise negative  Gastrointestinal: Negative for vomiting.  Endocrine:       Negative aside from HPI  Genitourinary:       Neg aside from HPI   Musculoskeletal:       Per HPI, otherwise negative  Skin: Positive for wound.  Neurological: Negative for syncope.  Psychiatric/Behavioral: Positive for decreased concentration and dysphoric mood. Negative for suicidal ideas. The patient is nervous/anxious.      Physical Exam Updated Vital Signs LMP 05/22/2018   Physical Exam  Constitutional: She is oriented to person, place, and time. She appears well-developed and well-nourished. No distress.  HENT:  Head: Normocephalic and atraumatic.  Eyes: Conjunctivae and EOM are normal.  Cardiovascular: Normal rate and regular rhythm.    Pulmonary/Chest: Effort normal and breath sounds normal. No stridor. No respiratory distress.  Abdominal: She exhibits no distension.  Genitourinary:     Musculoskeletal: She exhibits no edema.  Neurological: She is alert and oriented to person, place, and time. No cranial nerve deficit.  Skin: Skin is warm and dry.  Psychiatric: Her mood appears anxious.  Nursing note and vitals reviewed.    ED Treatments / Results   Procedures Procedures (including critical care time)  Medications Ordered in ED Medications  lidocaine (XYLOCAINE) 2 % jelly 1 application (has no administration in time range)     Initial Impression / Assessment and Plan / ED Course  I have reviewed the triage vital signs and the nursing notes.  Pertinent labs & imaging results that were available during my care of the patient were reviewed by me and considered in my medical decision making (see chart for details).  This young female 16 months status post delivery now presents with depression and concern of a skin lesion. Patient has no explicit suicidal ideation, has a long history of depression, but has not seen a practitioner in some time. Counseled her on the need to follow-up with mental health resources, and she was provided multiple resources to do so. In addition, the patient has no evidence for bacteremia, sepsis, no surrounding cellulitis, or inflammatory changes per Some suspicion for accidental injury from shaving. No lesion amenable to drainage currently. Patient received topical lidocaine, instructions on home care including sitz bath's, per scription for lidocaine.  Patient discharged in stable condition.   2:34 PM To discharge patient found to be hypertensive. Chart review notable for history of chronic hypertension, and the patient notes that she indeed was induced early in her last pregnancy due to this. Patient encouraged to take advantage of the resources provided to her to follow-up with  outpatient providers for blood pressure control in addition to assistance with the other issues. No evidence for hypertensive crisis, no confusion, encephalopathy, evidence for organ failure. Final Clinical Impressions(s) / ED Diagnoses  Depression Vulvar lesion   Gerhard Munch, MD 05/22/18 1328    Gerhard Munch, MD 05/22/18 1435

## 2018-05-22 NOTE — Telephone Encounter (Signed)
Patient called stating that she has this ingrown hair boil or something and would like to speak with a nurse. Please contact pt

## 2018-06-25 ENCOUNTER — Encounter: Payer: Self-pay | Admitting: *Deleted

## 2018-06-25 ENCOUNTER — Ambulatory Visit (INDEPENDENT_AMBULATORY_CARE_PROVIDER_SITE_OTHER): Payer: Medicaid Other | Admitting: Family Medicine

## 2018-06-25 VITALS — BP 163/108 | HR 79 | Temp 98.1°F | Ht 65.0 in | Wt 186.0 lb

## 2018-06-25 DIAGNOSIS — F411 Generalized anxiety disorder: Secondary | ICD-10-CM | POA: Diagnosis not present

## 2018-06-25 DIAGNOSIS — I10 Essential (primary) hypertension: Secondary | ICD-10-CM | POA: Diagnosis not present

## 2018-06-25 DIAGNOSIS — F320 Major depressive disorder, single episode, mild: Secondary | ICD-10-CM | POA: Diagnosis not present

## 2018-06-25 DIAGNOSIS — Z7689 Persons encountering health services in other specified circumstances: Secondary | ICD-10-CM | POA: Diagnosis not present

## 2018-06-25 MED ORDER — SERTRALINE HCL 50 MG PO TABS: 50.0000 mg | ORAL_TABLET | Freq: Every day | ORAL | 0 refills | Status: DC

## 2018-06-25 MED ORDER — HYDROCHLOROTHIAZIDE 25 MG PO TABS: 25.0000 mg | ORAL_TABLET | Freq: Every day | ORAL | 0 refills | Status: DC
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
## 2018-06-25 NOTE — Patient Instructions (Addendum)
See me back in 2 weeks to recheck your blood pressure.  We have started you on a medication called hydrochlorothiazide.  Take this when you wake up each day, as it will increase urination.  Monitor your blood pressures.  Decrease her salt intake.  I provided you a handout below with ways to eliminate salt from your diet.  Your goal blood pressure is less than 140/90.  For your anxiety and depression, we are starting a medication called Zoloft.  You will take 1 pill daily.  Start this on Friday.  You may notice that you have GI side effects as a result.  This will resolve after about 10 to 14 days.  You may use melatonin 3 mg 30 minutes to 1 hour before bedtime to help improve sleep.  When you get ready to have the Nexplanon taken out, call our office for a procedure appointment to have this done.  For the Mirena appointment, schedule this with Dr. Louanne Skye who will insert this for you.  Taking the medicine as directed and not missing any doses is one of the best things you can do to treat your depression.  Here are some things to keep in mind:  1) Side effects (stomach upset, some increased anxiety) may happen before you notice a benefit.  These side effects typically go away over time. 2) Changes to your dose of medicine or a change in medication all together is sometimes necessary 3) Most people need to be on medication at least 12 months 4) Many people will notice an improvement within two weeks but the full effect of the medication can take up to 4-6 weeks 5) Stopping the medication when you start feeling better often results in a return of symptoms 6) Never discontinue your medication without contacting a health care professional first.  Some medications require gradual discontinuation/ taper and can make you sick if you stop them abruptly.  If your symptoms worsen or you have thoughts of suicide/homicide, PLEASE SEEK IMMEDIATE MEDICAL ATTENTION.  You may always call:  National Suicide  Hotline: (214) 337-2254 [place] Crisis Line: (989)018-8845 Crisis Recovery in [place]: 947-665-2217   These are available 24 hours a day, 7 days a week.   DASH Eating Plan DASH stands for "Dietary Approaches to Stop Hypertension." The DASH eating plan is a healthy eating plan that has been shown to reduce high blood pressure (hypertension). It may also reduce your risk for type 2 diabetes, heart disease, and stroke. The DASH eating plan may also help with weight loss. What are tips for following this plan? General guidelines  Avoid eating more than 2,300 mg (milligrams) of salt (sodium) a day. If you have hypertension, you may need to reduce your sodium intake to 1,500 mg a day.  Limit alcohol intake to no more than 1 drink a day for nonpregnant women and 2 drinks a day for men. One drink equals 12 oz of beer, 5 oz of wine, or 1 oz of hard liquor.  Work with your health care provider to maintain a healthy body weight or to lose weight. Ask what an ideal weight is for you.  Get at least 30 minutes of exercise that causes your heart to beat faster (aerobic exercise) most days of the week. Activities may include walking, swimming, or biking.  Work with your health care provider or diet and nutrition specialist (dietitian) to adjust your eating plan to your individual calorie needs. Reading food labels  Check food labels for the amount  of sodium per serving. Choose foods with less than 5 percent of the Daily Value of sodium. Generally, foods with less than 300 mg of sodium per serving fit into this eating plan.  To find whole grains, look for the word "whole" as the first word in the ingredient list. Shopping  Buy products labeled as "low-sodium" or "no salt added."  Buy fresh foods. Avoid canned foods and premade or frozen meals. Cooking  Avoid adding salt when cooking. Use salt-free seasonings or herbs instead of table salt or sea salt. Check with your health care provider  or pharmacist before using salt substitutes.  Do not fry foods. Cook foods using healthy methods such as baking, boiling, grilling, and broiling instead.  Cook with heart-healthy oils, such as olive, canola, soybean, or sunflower oil. Meal planning   Eat a balanced diet that includes: ? 5 or more servings of fruits and vegetables each day. At each meal, try to fill half of your plate with fruits and vegetables. ? Up to 6-8 servings of whole grains each day. ? Less than 6 oz of lean meat, poultry, or fish each day. A 3-oz serving of meat is about the same size as a deck of cards. One egg equals 1 oz. ? 2 servings of low-fat dairy each day. ? A serving of nuts, seeds, or beans 5 times each week. ? Heart-healthy fats. Healthy fats called Omega-3 fatty acids are found in foods such as flaxseeds and coldwater fish, like sardines, salmon, and mackerel.  Limit how much you eat of the following: ? Canned or prepackaged foods. ? Food that is high in trans fat, such as fried foods. ? Food that is high in saturated fat, such as fatty meat. ? Sweets, desserts, sugary drinks, and other foods with added sugar. ? Full-fat dairy products.  Do not salt foods before eating.  Try to eat at least 2 vegetarian meals each week.  Eat more home-cooked food and less restaurant, buffet, and fast food.  When eating at a restaurant, ask that your food be prepared with less salt or no salt, if possible. What foods are recommended? The items listed may not be a complete list. Talk with your dietitian about what dietary choices are best for you. Grains Whole-grain or whole-wheat bread. Whole-grain or whole-wheat pasta. Brown rice. Orpah Cobbatmeal. Quinoa. Bulgur. Whole-grain and low-sodium cereals. Pita bread. Low-fat, low-sodium crackers. Whole-wheat flour tortillas. Vegetables Fresh or frozen vegetables (raw, steamed, roasted, or grilled). Low-sodium or reduced-sodium tomato and vegetable juice. Low-sodium or  reduced-sodium tomato sauce and tomato paste. Low-sodium or reduced-sodium canned vegetables. Fruits All fresh, dried, or frozen fruit. Canned fruit in natural juice (without added sugar). Meat and other protein foods Skinless chicken or [place]. Ground chicken or [place]. Pork with fat trimmed off. Fish and seafood. Egg whites. Dried beans, peas, or lentils. Unsalted nuts, nut butters, and seeds. Unsalted canned beans. Lean cuts of beef with fat trimmed off. Low-sodium, lean deli meat. Dairy Low-fat (1%) or fat-free (skim) milk. Fat-free, low-fat, or reduced-fat cheeses. Nonfat, low-sodium ricotta or cottage cheese. Low-fat or nonfat yogurt. Low-fat, low-sodium cheese. Fats and oils Soft margarine without trans fats. Vegetable oil. Low-fat, reduced-fat, or light mayonnaise and salad dressings (reduced-sodium). Canola, safflower, olive, soybean, and sunflower oils. Avocado. Seasoning and other foods Herbs. Spices. Seasoning mixes without salt. Unsalted popcorn and pretzels. Fat-free sweets. What foods are not recommended? The items listed may not be a complete list. Talk with your dietitian about what dietary choices are best  for you. Grains Baked goods made with fat, such as croissants, muffins, or some breads. Dry pasta or rice meal packs. Vegetables Creamed or fried vegetables. Vegetables in a cheese sauce. Regular canned vegetables (not low-sodium or reduced-sodium). Regular canned tomato sauce and paste (not low-sodium or reduced-sodium). Regular tomato and vegetable juice (not low-sodium or reduced-sodium). Rosita Fire. Olives. Fruits Canned fruit in a light or heavy syrup. Fried fruit. Fruit in cream or butter sauce. Meat and other protein foods Fatty cuts of meat. Ribs. Fried meat. Tomasa Blase. Sausage. Bologna and other processed lunch meats. Salami. Fatback. Hotdogs. Bratwurst. Salted nuts and seeds. Canned beans with added salt. Canned or smoked fish. Whole eggs or egg yolks. Chicken or [place]  with skin. Dairy Whole or 2% milk, cream, and half-and-half. Whole or full-fat cream cheese. Whole-fat or sweetened yogurt. Full-fat cheese. Nondairy creamers. Whipped toppings. Processed cheese and cheese spreads. Fats and oils Butter. Stick margarine. Lard. Shortening. Ghee. Bacon fat. Tropical oils, such as coconut, palm kernel, or palm oil. Seasoning and other foods Salted popcorn and pretzels. Onion salt, garlic salt, seasoned salt, table salt, and sea salt. Worcestershire sauce. Tartar sauce. Barbecue sauce. Teriyaki sauce. Soy sauce, including reduced-sodium. Steak sauce. Canned and packaged gravies. Fish sauce. Oyster sauce. Cocktail sauce. Horseradish that you find on the shelf. Ketchup. Mustard. Meat flavorings and tenderizers. Bouillon cubes. Hot sauce and Tabasco sauce. Premade or packaged marinades. Premade or packaged taco seasonings. Relishes. Regular salad dressings. Where to find more information:  National Heart, Lung, and Blood Institute: PopSteam.is  American Heart Association: www.heart.org Summary  The DASH eating plan is a healthy eating plan that has been shown to reduce high blood pressure (hypertension). It may also reduce your risk for type 2 diabetes, heart disease, and stroke.  With the DASH eating plan, you should limit salt (sodium) intake to 2,300 mg a day. If you have hypertension, you may need to reduce your sodium intake to 1,500 mg a day.  When on the DASH eating plan, aim to eat more fresh fruits and vegetables, whole grains, lean proteins, low-fat dairy, and heart-healthy fats.  Work with your health care provider or diet and nutrition specialist (dietitian) to adjust your eating plan to your individual calorie needs. This information is not intended to replace advice given to you by your health care provider. Make sure you discuss any questions you have with your health care provider. Document Released: 12/01/2011 Document Revised: 12/05/2016  Document Reviewed: 12/05/2016 Elsevier Interactive Patient Education  [employer].

## 2018-06-29 ENCOUNTER — Telehealth: Payer: Self-pay

## 2018-06-29 NOTE — Telephone Encounter (Signed)
[facility] - Left Msg 

## 2018-07-04 ENCOUNTER — Encounter: Payer: Self-pay | Admitting: Pediatrics

## 2018-07-04 ENCOUNTER — Ambulatory Visit: Payer: Medicaid Other | Admitting: Pediatrics

## 2018-07-04 VITALS — BP 175/118 | HR 79 | Temp 98.1°F | Ht 65.0 in | Wt 182.0 lb

## 2018-07-04 DIAGNOSIS — I10 Essential (primary) hypertension: Secondary | ICD-10-CM

## 2018-07-04 LAB — URINALYSIS, COMPLETE
BILIRUBIN UA: NEGATIVE
GLUCOSE, UA: NEGATIVE
Ketones, UA: NEGATIVE
Leukocytes, UA: NEGATIVE
NITRITE UA: NEGATIVE
PH UA: 6 (ref 5.0–7.5)
PROTEIN UA: NEGATIVE
Specific Gravity, UA: 1.025 (ref 1.005–1.030)
Urobilinogen, Ur: 1 mg/dL (ref 0.2–1.0)

## 2018-07-04 LAB — MICROSCOPIC EXAMINATION: Renal Epithel, UA: NONE SEEN /hpf

## 2018-07-04 MED ORDER — AMLODIPINE BESYLATE 5 MG PO TABS: 5.0000 mg | ORAL_TABLET | Freq: Every day | ORAL | 3 refills | Status: DC

## 2018-07-04 NOTE — Patient Instructions (Signed)
Check blood pressures at home. Let me know if regularly elevated >140 or >90.  Decrease caffeine intake

## 2018-07-04 NOTE — Progress Notes (Addendum)
  Subjective:   Patient ID: Deborah Klein, female    DOB: 08-12-1991, 27 y.o.   MRN: [id number] CC: Headache and Dizziness  HPI: Deborah Klein is a 27 y.o. female   She says she had normal blood pressures until recent pregnancy.  She is a 49-monthold at home.  Since then her blood pressures been elevated, 170s to 180s at home.  She has a headache that comes and goes.  For the last couple weeks she has been having more headaches than usual.  She was seen last week and started on hydrochlorothiazide and sertraline for anxiety.  She has a hard time sleeping because of all the things she is thinking about.  She works third shift and takes care of her kids during the day.  She has a family history of high blood pressure when people were in their middle ages.  She drinks 3-4 sodas a day, sometimes energy drinks.  Sometimes uses aspirin for headaches, not taking any other NSAIDs.  She has a Nexplanon for birth control.  Relevant past medical, surgical, family and social history reviewed. Allergies and medications reviewed and updated. Social History   Tobacco Use  Smoking Status Former Smoker  . Packs/day: 0.03  . Years: 2.00  . Pack years: 0.06  . Types: Cigarettes  Smokeless Tobacco Never Used   ROS: Per HPI   Objective:    BP (!) 175/118   Pulse 79   Temp 98.1 F (36.7 C) (Oral)   Ht '5\' 5"'$  (1.651 m)   Wt 182 lb (82.6 kg)   BMI 30.29 kg/m   Wt Readings from Last 3 Encounters:  07/04/18 182 lb (82.6 kg)  06/25/18 186 lb (84.4 kg)  01/17/18 188 lb (85.3 kg)    Gen: NAD, alert, cooperative with exam, NCAT EYES: EOMI, no conjunctival injection, or no icterus ENT:  TMs pearly gray b/l, OP without erythema LYMPH: no cervical LAD CV: NRRR, normal S1/S2, no murmur, distal pulses 2+ b/l Resp: CTABL, no wheezes, normal WOB Abd: +BS, soft, NTND. no guarding or organomegaly, no bruits Ext: No edema, warm Neuro: Alert and oriented, strength equal b/l UE and LE, coordination  grossly normal MSK: normal muscle bulk  Assessment & Plan:  Deborah Klein seen today for headache.   Diagnoses and all orders for this visit:  Essential hypertension Continue hydrochlorothiazide, start amlodipine.  Continue to check blood pressures at home.  Follow-up in 1 week.  Will get below labs. -     CBC with Differential/Platelet -     CMP14+EGFR -     Urinalysis, Complete -     Aldosterone + renin activity w/ ratio -     amLODipine (NORVASC) 5 MG tablet; Take 1 tablet (5 mg total) by mouth daily.   Follow up plan: Return in about 1 week (around 07/11/2018). CAssunta Found MD WRiley

## 2018-07-05 ENCOUNTER — Telehealth: Payer: Self-pay | Admitting: Pediatrics

## 2018-07-05 ENCOUNTER — Telehealth: Payer: Self-pay

## 2018-07-05 ENCOUNTER — Ambulatory Visit: Payer: Medicaid Other | Admitting: *Deleted

## 2018-07-05 ENCOUNTER — Other Ambulatory Visit: Payer: Medicaid Other

## 2018-07-05 ENCOUNTER — Other Ambulatory Visit: Payer: Self-pay | Admitting: Pediatrics

## 2018-07-05 VITALS — BP 172/118 | HR 85

## 2018-07-05 DIAGNOSIS — D751 Secondary polycythemia: Secondary | ICD-10-CM

## 2018-07-05 DIAGNOSIS — F411 Generalized anxiety disorder: Secondary | ICD-10-CM

## 2018-07-05 DIAGNOSIS — Z0130 Encounter for examination of blood pressure without abnormal findings: Secondary | ICD-10-CM

## 2018-07-05 DIAGNOSIS — I10 Essential (primary) hypertension: Secondary | ICD-10-CM

## 2018-07-05 MED ORDER — AMLODIPINE BESYLATE 5 MG PO TABS
10.0000 mg | ORAL_TABLET | Freq: Every day | ORAL | 3 refills | Status: DC
Start: 2018-07-05 — End: 2018-07-12

## 2018-07-05 NOTE — Progress Notes (Signed)
Pt here for BP check BP still elevated Blood work repeated appt scheduled for follow up next week per Dr Oswaldo DoneVincent

## 2018-07-05 NOTE — Telephone Encounter (Signed)
Did you call this pt about her labs? I don't see that you have reviewed them yet.
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
## 2018-07-05 NOTE — Telephone Encounter (Signed)
Hem[facility] yesterday was 19[facility]9 [facility]  Will bring patient back for repeat.

## 2018-07-06 LAB — CBC WITH DIFFERENTIAL/PLATELET
BASOS ABS: 0 10*3/uL (ref 0.0–0.2)
Basos: 1 %
EOS (ABSOLUTE): 0.2 10*3/uL (ref 0.0–0.4)
EOS: 3 %
HEMATOCRIT: 43 % (ref 34.0–46.6)
Hemoglobin: 14.9 g/dL (ref 11.1–15.9)
IMMATURE GRANULOCYTES: 0 %
Immature Grans (Abs): 0 10*3/uL (ref 0.0–0.1)
LYMPHS ABS: 2 10*3/uL (ref 0.7–3.1)
LYMPHS: 35 %
MCH: 28.7 pg (ref 26.6–33.0)
MCHC: 34.7 g/dL (ref 31.5–35.7)
MCV: 83 fL (ref 79–97)
MONOCYTES: 12 %
Monocytes Absolute: 0.7 10*3/uL (ref 0.1–0.9)
NEUTROS ABS: 2.9 10*3/uL (ref 1.4–7.0)
Neutrophils: 49 %
PLATELETS: 348 10*3/uL (ref 150–450)
RBC: 5.19 x10E6/uL (ref 3.77–5.28)
RDW: 13.4 % (ref 12.3–15.4)
WBC: 5.9 10*3/uL (ref 3.4–10.8)

## 2018-07-07 LAB — CMP14+EGFR
ALT: 17 IU/L (ref 0–32)
AST: 14 IU/L (ref 0–40)
Albumin/Globulin Ratio: 1.7 (ref 1.2–2.2)
Albumin: 4.5 g/dL (ref 3.5–5.5)
Alkaline Phosphatase: 65 IU/L (ref 39–117)
BUN/Creatinine Ratio: 14 (ref 9–23)
BUN: 12 mg/dL (ref 6–20)
Bilirubin Total: 0.5 mg/dL (ref 0.0–1.2)
CALCIUM: 9.8 mg/dL (ref 8.7–10.2)
CO2: 25 mmol/L (ref 20–29)
CREATININE: 0.84 mg/dL (ref 0.57–1.00)
Chloride: 102 mmol/L (ref 96–106)
GFR calc Af Amer: 111 mL/min/{1.73_m2} (ref >59)
GFR, EST NON AFRICAN AMERICAN: 96 mL/min/{1.73_m2} (ref >59)
GLUCOSE: 56 mg/dL — AB (ref 65–99)
Globulin, Total: 2.7 g/dL (ref 1.5–4.5)
Potassium: 3.5 mmol/L (ref 3.5–5.2)
Sodium: 142 mmol/L (ref 134–144)
Total Protein: 7.2 g/dL (ref 6.0–8.5)

## 2018-07-07 LAB — CBC WITH DIFFERENTIAL/PLATELET
Basophils Absolute: 0 10*3/uL (ref 0.0–0.2)
Basos: 0 %
EOS (ABSOLUTE): 0.2 10*3/uL (ref 0.0–0.4)
Eos: 3 %
HEMOGLOBIN: 19.9 g/dL — AB (ref 11.1–15.9)
Hematocrit: 55.4 % — ABNORMAL HIGH (ref 34.0–46.6)
IMMATURE GRANULOCYTES: 0 %
Immature Grans (Abs): 0 10*3/uL (ref 0.0–0.1)
LYMPHS: 31 %
Lymphocytes Absolute: 2.3 10*3/uL (ref 0.7–3.1)
MCH: 29.7 pg (ref 26.6–33.0)
MCHC: 35.9 g/dL — ABNORMAL HIGH (ref 31.5–35.7)
MCV: 83 fL (ref 79–97)
MONOS ABS: 1.1 10*3/uL — AB (ref 0.1–0.9)
Monocytes: 14 %
Neutrophils Absolute: 3.9 10*3/uL (ref 1.4–7.0)
Neutrophils: 52 %
Platelets: 396 10*3/uL (ref 150–450)
RBC: 6.69 x10E6/uL — AB (ref 3.77–5.28)
RDW: 13.9 % (ref 12.3–15.4)
WBC: 7.6 10*3/uL (ref 3.4–10.8)

## 2018-07-07 LAB — ALDOSTERONE + RENIN ACTIVITY W/ RATIO
ALDOS/RENIN RATIO: 1.3 (ref 0.0–30.0)
ALDOSTERONE: 14.1 ng/dL (ref 0.0–30.0)
Renin: 10.545 ng/mL/hr — ABNORMAL HIGH (ref 0.167–5.380)

## 2018-07-09 ENCOUNTER — Ambulatory Visit: Payer: Medicaid Other | Admitting: Pediatrics

## 2018-07-09 ENCOUNTER — Ambulatory Visit: Payer: Self-pay | Admitting: Family Medicine

## 2018-07-09 ENCOUNTER — Encounter: Payer: Self-pay | Admitting: Pediatrics

## 2018-07-09 VITALS — BP 161/106 | HR 79 | Temp 97.6°F | Ht 65.0 in | Wt 180.6 lb

## 2018-07-09 DIAGNOSIS — I10 Essential (primary) hypertension: Secondary | ICD-10-CM

## 2018-07-09 DIAGNOSIS — Z3046 Encounter for surveillance of implantable subdermal contraceptive: Secondary | ICD-10-CM

## 2018-07-09 DIAGNOSIS — Z30432 Encounter for removal of intrauterine contraceptive device: Secondary | ICD-10-CM

## 2018-07-09 NOTE — Patient Instructions (Addendum)
Condoms for birth control  Continue to check Blood pressures at home Goal 120s/70s-80s Let me know if persistently elevated or if starts to drop low

## 2018-07-09 NOTE — Progress Notes (Signed)
  Subjective:   Patient ID: Deborah Klein, female    DOB: 08-11-1991, [age] y.o.   MRN: [id number] CC: Nexplanon removal  HPI: Deborah Klein is a 27 y.o. female   Ongoing elevated blood pressure.  Headaches have been slightly better since emergency room visit.  Has been present since Nexplanon placed year and a half ago.  Will try removing Nexplanon, see if blood pressure improves.  Relevant past medical, surgical, family and social history reviewed. Allergies and medications reviewed and updated. Social History   Tobacco Use  Smoking Status Former Smoker  . Packs/day: 0.03  . Years: 2.00  . Pack years: 0.06  . Types: Cigarettes  Smokeless Tobacco Never Used   ROS: Per HPI   Objective:    BP (!) 161/106   Pulse 79   Temp 97.6 F (36.4 C) (Oral)   Ht 5\' 5"  (1.651 m)   Wt 180 lb 9.6 oz (81.9 kg)   BMI 30.05 kg/m   Wt Readings from Last 3 Encounters:  07/09/18 180 lb 9.6 oz (81.9 kg)  07/04/18 182 lb (82.6 kg)  06/25/18 186 lb (84.4 kg)    Gen: NAD, alert, cooperative with exam, NCAT EYES: EOMI, no conjunctival injection, or no icterus CV: NRRR, normal S1/S2, no murmur, distal pulses 2+ b/l Resp: CTABL, no wheezes, normal WOB Abd: +BS, soft, NTND. no guarding or organomegaly Ext: No edema, warm Neuro: Alert and oriented, strength equal b/l UE and LE, coordination grossly normal  Assessment & Plan:  Deborah Klein was seen today for nexplanon removal.  Diagnoses and all orders for this visit:  Nexplanon removal Patient to use barrier protection for birth control until follow-up visit.  Nonhormonal forms include copper IUD as well.  Hypertension, unspecified type Continue to check blood pressures at home.  Let me know if remains elevated or if starts to drop below know the Nexplanon is removed.  Procedure: Patient counseled on contraceptive implant removal. Implant palpated in Left arm. Area cleaned with betadine. Tip of implant palpated, and area under tip  infiltrated with 3 cc 1% lidocaine. A 3 mm incision parallel to the implant was made in the arm, just over the implant. The implant was pushed forward into the incision, and removed using a hemostat. The skin was reapproximated using steristrips, and a pressure dressing was applied. The patient tolerated the procedure well.   Follow up plan: Return in about 3 weeks (around 07/30/2018). Deborah Krasarol Aliscia Clayton, MD [facility]

## 2018-07-10 ENCOUNTER — Telehealth: Payer: Self-pay | Admitting: Family Medicine

## 2018-07-10 NOTE — Telephone Encounter (Signed)
Please review and advise.

## 2018-07-10 NOTE — Telephone Encounter (Signed)
How high is high?  If >180/110s she needs to be seen ASAP.  If no appts available in night clinic, UC or ED.

## 2018-07-11 ENCOUNTER — Telehealth: Payer: Self-pay | Admitting: Family Medicine

## 2018-07-11 NOTE — Telephone Encounter (Signed)
Patient aware letter is ready for pick up.

## 2018-07-11 NOTE — Telephone Encounter (Signed)
How her blood pressures today?  She having any symptoms?

## 2018-07-11 NOTE — Telephone Encounter (Signed)
Fine to write note, OK to go back Friday due to ongoing medical problems

## 2018-07-11 NOTE — Telephone Encounter (Signed)
D/w pt, still with headache, BP somewhat improved. Note given for work, OK to return tomorrow as long as symptoms have improved.

## 2018-07-11 NOTE — Telephone Encounter (Signed)
Patient called at 6:30 pm asking if she should go to work at 11 pm because her blood pressures were 168/109,  158/112, with heart rate of 101.  She also wants to know if she should have a note to stay out of work.  Advised that these questions would be addressed by her provider in the AM.

## 2018-07-11 NOTE — Telephone Encounter (Signed)
Patient states that this morning her bp was 132/113 p- 102.  States her "head doesn't feel right".  Also c/o dizziness and headache that won't go away. Patient needs a work note for lastnight and also wants to know if she should go to work today? Please advise

## 2018-07-12 ENCOUNTER — Ambulatory Visit: Payer: Medicaid Other | Admitting: Pediatrics

## 2018-07-12 ENCOUNTER — Encounter: Payer: Self-pay | Admitting: Pediatrics

## 2018-07-12 ENCOUNTER — Telehealth: Payer: Self-pay | Admitting: Family Medicine

## 2018-07-12 VITALS — BP 153/109 | HR 92 | Temp 98.4°F | Ht 65.0 in | Wt 178.0 lb

## 2018-07-12 DIAGNOSIS — Z789 Other specified health status: Secondary | ICD-10-CM | POA: Diagnosis not present

## 2018-07-12 DIAGNOSIS — I150 Renovascular hypertension: Secondary | ICD-10-CM | POA: Diagnosis not present

## 2018-07-12 DIAGNOSIS — I10 Essential (primary) hypertension: Secondary | ICD-10-CM | POA: Diagnosis not present

## 2018-07-12 DIAGNOSIS — IMO0001 Reserved for inherently not codable concepts without codable children: Secondary | ICD-10-CM

## 2018-07-12 MED ORDER — LISINOPRIL 10 MG PO TABS: 10.0000 mg | ORAL_TABLET | Freq: Every day | ORAL | 3 refills | Status: DC

## 2018-07-12 MED ORDER — AMLODIPINE BESYLATE 5 MG PO TABS
10.0000 mg | ORAL_TABLET | Freq: Every day | ORAL | 3 refills | Status: DC
Start: 2018-07-12 — End: 2018-07-26

## 2018-07-12 MED ORDER — LABETALOL HCL 100 MG PO TABS: ORAL_TABLET | ORAL | 1 refills | Status: DC

## 2018-07-12 MED ORDER — MEDROXYPROGESTERONE ACETATE 150 MG/ML IM SUSP: 150.0000 mg | Freq: Once | INTRAMUSCULAR | 3 refills | Status: DC

## 2018-07-12 NOTE — Telephone Encounter (Signed)
Spoke with pt and she states she hasn't taken the Labetolol yet but took her amlodipine and then vomited a small amount of "foam" about 10 minutes after taking the amlodipine but didn't notice anything that looked like the pill in the "foam". 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

## 2018-07-12 NOTE — Telephone Encounter (Signed)
D/w pt, note written

## 2018-07-12 NOTE — Progress Notes (Signed)
  Subjective:   Patient ID: Deborah Klein, female    DOB: 1991-09-29, 27 y.o.   MRN: [id number] CC: Headache and Hypertension  HPI: Deborah Cailexandria Siravo is a 27 y.o. female   BP remains elevated, 140s to 150s systolic, 100s to 110s diastolic at home.  Patient continues to have a headache.  She is taking hydrochlorothiazide 25 mg and amlodipine 10 mg daily.  Nexplanon was removed 3 days ago over concern it could be elevating blood pressure.  She says her mom used to tell her she would stop breathing when she was in elementary school and had asthma at night.  She is been told she snores sometimes, she is not sure if she has any pauses with her breathing.  Relevant past medical, surgical, family and social history reviewed. Allergies and medications reviewed and updated. Social History   Tobacco Use  Smoking Status Former Smoker  . Packs/day: 0.03  . Years: 2.00  . Pack years: 0.06  . Types: Cigarettes  Smokeless Tobacco Never Used   ROS: Per HPI   Objective:    BP (!) 153/109   Pulse 92   Temp 98.4 F (36.9 C) (Oral)   Ht 5\' 5"  (1.651 m)   Wt 178 lb (80.7 kg)   BMI 29.62 kg/m   Wt Readings from Last 3 Encounters:  07/12/18 178 lb (80.7 kg)  07/09/18 180 lb 9.6 oz (81.9 kg)  07/04/18 182 lb (82.6 kg)    Gen: NAD, alert, cooperative with exam, NCAT EYES: EOMI, no conjunctival injection, or no icterus ENT:  TMs pearly gray b/l, OP without erythema LYMPH: no cervical LAD CV: NRRR, normal S1/S2, no murmur, distal pulses 2+ b/l Resp: CTABL, no wheezes, normal WOB Abd: +BS, soft, NTND. no guarding or organomegaly Ext: No edema, warm Neuro: Alert and oriented, strength equal b/l UE and LE, coordination grossly normal MSK: normal muscle bulk  Assessment & Plan:  Deborah Klein was seen today for headache and hypertension.  Diagnoses and all orders for this visit:  Hypertension, unspecified type Blood pressure remains elevated.  Discussed with nephrology.  Will get imaging to  rule out renal artery stenosis.  Given elevated renin level will start lisinopril.  Patient must remain on birth control while on this medicine.  Patient is going to start Depo-Provera injections for birth control, use condoms for back-up birth control.  Renin elevation could be from hydrochlorothiazide that was started the week before.  For now continue amlodipine, hydrochlorothiazide. -     Ambulatory referral to Nephrology -     lisinopril (PRINIVIL,ZESTRIL) 10 MG tablet; Take 1 tablet (10 mg total) by mouth daily.  Renovascular hypertension -     CT ANGIO ABDOMEN W &/OR WO CONTRAST; Future  Essential hypertension -     amLODipine (NORVASC) 5 MG tablet; Take 2 tablets (10 mg total) by mouth daily.  Birth control -     medroxyPROGESTERone (DEPO-PROVERA) 150 MG/ML injection; Inject 1 mL (150 mg total) into the muscle once for 1 dose.   Follow up plan: Return in about 1 week (around 07/19/2018).  Note given for work.  Return precautions discussed. Rex Krasarol Vincent, MD [facility]

## 2018-07-13 NOTE — Telephone Encounter (Signed)
Returned call, headache improved.

## 2018-07-13 NOTE — Telephone Encounter (Signed)
Pt calling today to give Oswaldo DoneVincent her bp reading 133/96

## 2018-07-24 ENCOUNTER — Telehealth: Payer: Self-pay | Admitting: Family Medicine

## 2018-07-24 NOTE — Telephone Encounter (Signed)
Pt aware.

## 2018-07-24 NOTE — Telephone Encounter (Signed)
Recommend that she taper off medication every other day x1 week then stop to avoid side effects.  Ok to discontinue after that.

## 2018-07-25 ENCOUNTER — Other Ambulatory Visit: Payer: Self-pay | Admitting: Family Medicine

## 2018-07-25 NOTE — Telephone Encounter (Signed)
Patient would like to confirm that you would like for her to continue the Hctz while taking the Lisinopril and Amlodipine.  Please advise.

## 2018-07-25 NOTE — Telephone Encounter (Signed)
What have her blood pressures been? Can she come in for a BP follow up visit? OK to send in refill. Any lightheadedness or dizziness? Any headaches still?

## 2018-07-25 NOTE — Telephone Encounter (Signed)
Left message to call back  

## 2018-07-26 ENCOUNTER — Ambulatory Visit: Payer: Medicaid Other | Admitting: Pediatrics

## 2018-07-26 ENCOUNTER — Encounter: Payer: Self-pay | Admitting: Pediatrics

## 2018-07-26 VITALS — BP 105/70 | HR 84 | Temp 97.6°F | Ht 65.0 in | Wt 184.0 lb

## 2018-07-26 DIAGNOSIS — I10 Essential (primary) hypertension: Secondary | ICD-10-CM

## 2018-07-26 DIAGNOSIS — Z789 Other specified health status: Secondary | ICD-10-CM | POA: Diagnosis not present

## 2018-07-26 DIAGNOSIS — IMO0001 Reserved for inherently not codable concepts without codable children: Secondary | ICD-10-CM

## 2018-07-26 MED ORDER — HYDROCHLOROTHIAZIDE 25 MG PO TABS: 25.0000 mg | ORAL_TABLET | Freq: Every day | ORAL | 3 refills | Status: DC

## 2018-07-26 MED ORDER — AMLODIPINE BESYLATE 10 MG PO TABS: 10.0000 mg | ORAL_TABLET | Freq: Every day | ORAL | 3 refills | Status: DC

## 2018-07-26 NOTE — Telephone Encounter (Signed)
Pt states her bp have been 130's/80's and then at specialist 124/64. Pt denies headaches and other symptoms and was scheduled for f/u today with Dr Oswaldo DoneVincent at 2:15 for BP check.

## 2018-07-26 NOTE — Patient Instructions (Signed)
Check blood pressures at home. Let me know if stay > 140 on top or >90 on bottom.   Bring birth control shot back when you are able for us to give.

## 2018-07-26 NOTE — Progress Notes (Signed)
  Subjective:   Patient ID: Deborah Klein, female    DOB: 1991/06/22, 27 y.o.   MRN: [id number] CC: Blood Pressure Check  HPI: Deborah Klein is a 27 y.o. female   Blood pressures at home have been around 124/80, sometimes lower.  She sometimes gets lightheaded when she stands up quickly.  Headaches much improved.  She stopped taking hydrochlorthiazide yesterday because she ran out.  She would like to stop that medicine because she is having to go to the bathroom too much at work and is getting in trouble.    She did see nephrology.  She has an ultrasound of her kidneys to be scheduled and is going to have a 24-hour urine collection along with it.  She has not yet picked up the Depo-provera shot.  Relevant past medical, surgical, family and social history reviewed. Allergies and medications reviewed and updated. Social History   Tobacco Use  Smoking Status Former Smoker  . Packs/day: 0.03  . Years: 2.00  . Pack years: 0.06  . Types: Cigarettes  Smokeless Tobacco Never Used   ROS: Per HPI   Objective:    BP 105/70   Pulse 84   Temp 97.6 F (36.4 C) (Oral)   Ht 5\' 5"  (1.651 m)   Wt 184 lb (83.5 kg)   BMI 30.62 kg/m   Wt Readings from Last 3 Encounters:  07/26/18 184 lb (83.5 kg)  07/12/18 178 lb (80.7 kg)  07/09/18 180 lb 9.6 oz (81.9 kg)    Gen: NAD, alert, cooperative with exam, NCAT EYES: EOMI, no conjunctival injection, or no icterus CV: NRRR, normal S1/S2, no murmur, distal pulses 2+ b/l Resp: CTABL, no wheezes, normal WOB Abd: +BS, soft, NTND. no guarding or organomegaly Ext: No edema, warm Neuro: Alert and oriented, strength equal b/l UE and LE, coordination grossly normal MSK: normal muscle bulk  Assessment & Plan:  Deborah Klein was seen today for blood pressure check.  Diagnoses and all orders for this visit:  Essential hypertension Much improved.  Blood pressure on low side off of hydrochlorthiazide for over 24 hours.  Will stop hydrochlorthiazide  at this point, starting to affect her work.  Continue amlodipine and lisinopril.  Continue to check blood pressures at home.  Follow-up with nephrology as scheduled.  Patient aware of importance of not getting pregnant while on these medicines.  Discussed again. -     amLODipine (NORVASC) 10 MG tablet; Take 1 tablet (10 mg total) by mouth daily.  Birth control Patient to bring back Keppra shot.  Will need pregnancy test before shot is given. -     Pregnancy, urine; Future   Follow up plan: Return in about 3 weeks (around 08/16/2018). Deborah Krasarol Antaniya Venuti, MD [facility]

## 2018-08-03 ENCOUNTER — Telehealth: Payer: Self-pay | Admitting: Pediatrics

## 2018-08-03 NOTE — Telephone Encounter (Signed)
Returned patient's phone call.  Patient states that she has been having chest tightness and pain on the right side about breast.  BP in range of 120s/70s.  Some dyspnea noted on exertion and dizziness with movement usually while at work. Patient has been taking medication as prescribed.

## 2018-08-06 NOTE — Telephone Encounter (Signed)
D/w pt by phone 8/9

## 2018-08-08 ENCOUNTER — Ambulatory Visit (HOSPITAL_COMMUNITY): Admission: RE | Admit: 2018-08-08 | Payer: Medicaid Other | Source: Ambulatory Visit

## 2018-08-20 ENCOUNTER — Telehealth: Payer: Self-pay | Admitting: Family Medicine

## 2018-08-20 NOTE — Telephone Encounter (Signed)
Returned patient's phone call.  Patient states that she has had intermittent headaches for 2 days with no relief from ibuprofen.  BP reading 126/100 75 at 12PM, 121 83.  Dizziness with headaches, no appetite. Patient was out of work Saturday and Sunday. Can something be prescribed for headaches, or work note for [employer].  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

## 2018-08-21 ENCOUNTER — Telehealth: Payer: Self-pay | Admitting: Family Medicine

## 2018-08-21 NOTE — Telephone Encounter (Signed)
Attempted to contact patient about appointment

## 2018-08-22 ENCOUNTER — Ambulatory Visit: Payer: Medicaid Other | Admitting: Pediatrics

## 2018-08-22 ENCOUNTER — Encounter: Payer: Self-pay | Admitting: Pediatrics

## 2018-08-22 VITALS — BP 103/69 | HR 74 | Temp 97.8°F | Ht 65.0 in | Wt 186.2 lb

## 2018-08-22 DIAGNOSIS — R42 Dizziness and giddiness: Secondary | ICD-10-CM | POA: Diagnosis not present

## 2018-08-22 DIAGNOSIS — IMO0001 Reserved for inherently not codable concepts without codable children: Secondary | ICD-10-CM

## 2018-08-22 DIAGNOSIS — Z789 Other specified health status: Secondary | ICD-10-CM

## 2018-08-22 DIAGNOSIS — I10 Essential (primary) hypertension: Secondary | ICD-10-CM | POA: Diagnosis not present

## 2018-08-22 LAB — PREGNANCY, URINE: PREG TEST UR: NEGATIVE

## 2018-08-22 MED ORDER — MEDROXYPROGESTERONE ACETATE 150 MG/ML IM SUSY: 150.0000 mg | PREFILLED_SYRINGE | INTRAMUSCULAR | Status: AC

## 2018-08-22 MED ORDER — MEDROXYPROGESTERONE ACETATE 150 MG/ML IM SUSP
150.0000 mg | Freq: Once | INTRAMUSCULAR | Status: AC
  Administered 2018-08-22: 150 mg via INTRAMUSCULAR

## 2018-08-22 NOTE — Progress Notes (Signed)
  Subjective:   Patient ID: Karenann CaiAlexandria Chervenak, female    DOB: 07/09/1991, 27 y.o.   MRN: [id number] CC: Headache (moved from front to back of head and neck) and Fatigue  HPI: Karenann Cailexandria Gladwell is a 27 y.o. female   Taking medicines regularly.  For the last couple weeks she is gotten lightheaded when she stands up.  Sometimes when she bends over and stands up too quickly she also gets lightheaded.  Her head is been hurting some off and on.  Relevant past medical, surgical, family and social history reviewed. Allergies and medications reviewed and updated. Social History   Tobacco Use  Smoking Status Former Smoker  . Packs/day: 0.03  . Years: 2.00  . Pack years: 0.06  . Types: Cigarettes  Smokeless Tobacco Never Used   ROS: Per HPI   Objective:    BP 103/69   Pulse 74   Temp 97.8 F (36.6 C) (Oral)   Ht 5\' 5"  (1.651 m)   Wt 186 lb 3.2 oz (84.5 kg)   BMI 30.99 kg/m   Wt Readings from Last 3 Encounters:  08/22/18 186 lb 3.2 oz (84.5 kg)  07/26/18 184 lb (83.5 kg)  07/12/18 178 lb (80.7 kg)    Orthostatics: While blood pressure remained fairly stable, heart rate went from 74 lying down, 89 sitting to 104 with standing.  Gen: NAD, alert, cooperative with exam, NCAT EYES: EOMI, no conjunctival injection, or no icterus ENT:  TMs pearly gray b/l, OP without erythema LYMPH: no cervical LAD CV: NRRR, normal S1/S2, no murmur, distal pulses 2+ b/l Resp: CTABL, no wheezes, normal WOB Abd: +BS, soft, NTND. no guarding or organomegaly Ext: No edema, warm Neuro: Alert and oriented, strength equal b/l UE and LE, coordination grossly normal  MSK: normal muscle bulk  Assessment & Plan:  [name] was seen today for headache and fatigue.  Diagnoses and all orders for this visit:  Essential hypertension Lightheadedness Orthostatic today.  Stop amlodipine.  Continue lisinopril.  May need to increase lisinopril blood pressure goes up.  Check at home. Return precautions  discussed.  Contraception -     Pregnancy, urine -     medroxyPROGESTERone Acetate SUSY 150 mg -     medroxyPROGESTERone (DEPO-PROVERA) injection 150 mg   Follow up plan: Return in about 3 weeks (around 09/12/2018). Rex Krasarol Woodley Petzold, MD [facility]

## 2018-11-20 ENCOUNTER — Telehealth: Payer: Self-pay | Admitting: *Deleted

## 2018-11-20 NOTE — Telephone Encounter (Signed)
Pt declined flu shot °

## 2018-12-03 ENCOUNTER — Telehealth: Payer: Self-pay | Admitting: Family Medicine

## 2018-12-03 NOTE — Telephone Encounter (Signed)
Patient aware she was due between 11/13-11/27

## 2019-02-15 ENCOUNTER — Ambulatory Visit: Payer: Medicaid Other | Admitting: Family Medicine

## 2019-02-18 ENCOUNTER — Encounter: Payer: Self-pay | Admitting: Family Medicine

## 2019-09-17 ENCOUNTER — Other Ambulatory Visit: Payer: Self-pay

## 2019-09-18 ENCOUNTER — Encounter: Payer: Self-pay | Admitting: Family Medicine

## 2019-09-18 ENCOUNTER — Ambulatory Visit: Payer: Medicaid Other | Admitting: Family Medicine

## 2019-12-09 ENCOUNTER — Other Ambulatory Visit: Payer: Self-pay | Admitting: *Deleted

## 2019-12-09 DIAGNOSIS — I10 Essential (primary) hypertension: Secondary | ICD-10-CM

## 2020-04-04 DIAGNOSIS — S161XXA Strain of muscle, fascia and tendon at neck level, initial encounter: Secondary | ICD-10-CM | POA: Diagnosis not present

## 2020-04-04 DIAGNOSIS — F329 Major depressive disorder, single episode, unspecified: Secondary | ICD-10-CM | POA: Diagnosis not present

## 2020-04-04 DIAGNOSIS — R112 Nausea with vomiting, unspecified: Secondary | ICD-10-CM | POA: Diagnosis not present

## 2020-04-04 DIAGNOSIS — Z79899 Other long term (current) drug therapy: Secondary | ICD-10-CM | POA: Diagnosis not present

## 2020-04-04 DIAGNOSIS — Z9114 Patient's other noncompliance with medication regimen: Secondary | ICD-10-CM | POA: Diagnosis not present

## 2020-04-04 DIAGNOSIS — I10 Essential (primary) hypertension: Secondary | ICD-10-CM | POA: Diagnosis not present

## 2020-04-04 DIAGNOSIS — J45909 Unspecified asthma, uncomplicated: Secondary | ICD-10-CM | POA: Diagnosis not present

## 2020-04-04 DIAGNOSIS — Z87891 Personal history of nicotine dependence: Secondary | ICD-10-CM | POA: Diagnosis not present

## 2020-04-26 DIAGNOSIS — M79643 Pain in unspecified hand: Secondary | ICD-10-CM | POA: Diagnosis not present

## 2020-04-26 DIAGNOSIS — S0083XA Contusion of other part of head, initial encounter: Secondary | ICD-10-CM | POA: Diagnosis not present

## 2020-04-26 DIAGNOSIS — R000 Tachycardia, unspecified: Secondary | ICD-10-CM | POA: Diagnosis not present

## 2020-04-26 DIAGNOSIS — S40811A Abrasion of right upper arm, initial encounter: Secondary | ICD-10-CM | POA: Diagnosis not present

## 2020-04-26 DIAGNOSIS — M79605 Pain in left leg: Secondary | ICD-10-CM | POA: Diagnosis not present

## 2020-04-26 DIAGNOSIS — R220 Localized swelling, mass and lump, head: Secondary | ICD-10-CM | POA: Diagnosis not present

## 2020-04-26 DIAGNOSIS — R519 Headache, unspecified: Secondary | ICD-10-CM | POA: Diagnosis not present

## 2020-04-26 DIAGNOSIS — S20319A Abrasion of unspecified front wall of thorax, initial encounter: Secondary | ICD-10-CM | POA: Diagnosis not present

## 2020-04-26 DIAGNOSIS — Z043 Encounter for examination and observation following other accident: Secondary | ICD-10-CM | POA: Diagnosis not present

## 2020-04-26 DIAGNOSIS — I10 Essential (primary) hypertension: Secondary | ICD-10-CM | POA: Diagnosis not present

## 2020-04-26 DIAGNOSIS — R52 Pain, unspecified: Secondary | ICD-10-CM | POA: Diagnosis not present

## 2020-04-26 DIAGNOSIS — S40812A Abrasion of left upper arm, initial encounter: Secondary | ICD-10-CM | POA: Diagnosis not present

## 2020-04-26 DIAGNOSIS — S63501A Unspecified sprain of right wrist, initial encounter: Secondary | ICD-10-CM | POA: Diagnosis not present

## 2020-10-22 ENCOUNTER — Telehealth: Payer: Self-pay | Admitting: Family Medicine

## 2020-10-22 NOTE — Telephone Encounter (Signed)
Pt was made appt for 10/23/20 Has not been seen since 2019

## 2020-10-22 NOTE — Telephone Encounter (Signed)
Pt is requesting that her bp rx refill be sent to pharmacy, says that they have not received

## 2020-10-23 ENCOUNTER — Ambulatory Visit: Payer: Medicaid Other | Admitting: Family Medicine

## 2020-10-23 NOTE — Progress Notes (Deleted)
Subjective: CC:est care, *** PCP: Raliegh Ip, DO GGY:IRSWNIOEVO Deborah Klein is a 29 y.o. female presenting to clinic today for:  1. ***   ROS: Per HPI  No Known Allergies Past Medical History:  Diagnosis Date  . Anxiety   . Asthma   . Depression   . Hypertension     Current Outpatient Medications:  .  lisinopril (PRINIVIL,ZESTRIL) 10 MG tablet, Take 1 tablet (10 mg total) by mouth daily., Disp: 90 tablet, Rfl: 3 .  medroxyPROGESTERone (DEPO-PROVERA) 150 MG/ML injection, Inject 1 mL (150 mg total) into the muscle once for 1 dose., Disp: 1 mL, Rfl: 3 Social History   Socioeconomic History  . Marital status: Single    Spouse name: Not on file  . Number of children: 2  . Years of education: Not on file  . Highest education level: Not on file  Occupational History  . Not on file  Tobacco Use  . Smoking status: Former Smoker    Packs/day: 0.03    Years: 2.00    Pack years: 0.06    Types: Cigarettes  . Smokeless tobacco: Never Used  Vaping Use  . Vaping Use: Never used  Substance and Sexual Activity  . Alcohol use: No    Comment: social  . Drug use: No  . Sexual activity: Not Currently    Birth control/protection: Implant  Other Topics Concern  . Not on file  Social History Narrative  . Not on file   Social Determinants of Health   Financial Resource Strain:   . Difficulty of Paying Living Expenses: Not on file  Food Insecurity:   . Worried About Programme researcher, broadcasting/film/video in the Last Year: Not on file  . Ran Out of Food in the Last Year: Not on file  Transportation Needs:   . Lack of Transportation (Medical): Not on file  . Lack of Transportation (Non-Medical): Not on file  Physical Activity:   . Days of Exercise per Week: Not on file  . Minutes of Exercise per Session: Not on file  Stress:   . Feeling of Stress : Not on file  Social Connections:   . Frequency of Communication with Friends and Family: Not on file  . Frequency of Social Gatherings with  Friends and Family: Not on file  . Attends Religious Services: Not on file  . Active Member of Clubs or Organizations: Not on file  . Attends Banker Meetings: Not on file  . Marital Status: Not on file  Intimate Partner Violence:   . Fear of Current or Ex-Partner: Not on file  . Emotionally Abused: Not on file  . Physically Abused: Not on file  . Sexually Abused: Not on file   Family History  Problem Relation Age of Onset  . Depression Mother   . Depression Sister   . Other Brother        killed at age 47  . Hypertension Maternal Grandmother   . Hypertension Maternal Grandfather   . Stroke Maternal Grandfather     Objective: Office vital signs reviewed. There were no vitals taken for this visit.  Physical Examination:  General: Awake, alert, *** nourished, No acute distress HEENT: Normal    Neck: No masses palpated. No lymphadenopathy    Ears: Tympanic membranes intact, normal light reflex, no erythema, no bulging    Eyes: PERRLA, extraocular membranes intact, sclera ***    Nose: nasal turbinates moist, *** nasal discharge    Throat: moist mucus membranes,  no erythema, *** tonsillar exudate.  Airway is patent Cardio: regular rate and rhythm, S1S2 heard, no murmurs appreciated Pulm: clear to auscultation bilaterally, no wheezes, rhonchi or rales; normal work of breathing on room air GI: soft, non-tender, non-distended, bowel sounds present x4, no hepatomegaly, no splenomegaly, no masses GU: external vaginal tissue ***, cervix ***, *** punctate lesions on cervix appreciated, *** discharge from cervical os, *** bleeding, *** cervical motion tenderness, *** abdominal/ adnexal masses Extremities: warm, well perfused, No edema, cyanosis or clubbing; +*** pulses bilaterally MSK: *** gait and *** station Skin: dry; intact; no rashes or lesions Neuro: *** Strength and light touch sensation grossly intact, *** DTRs ***/4  Assessment/ Plan: 29 y.o. female   ***  No  orders of the defined types were placed in this encounter.  No orders of the defined types were placed in this encounter.    Raliegh Ip, DO [facility] [phone]

## 2020-10-27 ENCOUNTER — Encounter: Payer: Self-pay | Admitting: Family Medicine

## 2020-11-09 ENCOUNTER — Telehealth: Payer: Self-pay

## 2020-11-09 NOTE — Telephone Encounter (Signed)
Appointment changed

## 2020-11-09 NOTE — Telephone Encounter (Signed)
Pt called to schedule appt to see Dr Nadine Counts for med refill on her BP med because she is out. Pt scheduled appt on 12/22/20 (first available appt slot) but needs enough medicine called in for her to last her until her appt unless she can be worked in sooner.  Please advise.

## 2020-11-09 NOTE — Telephone Encounter (Signed)
Please review and advise if patient can get lisinopril called in to her appointment.

## 2020-11-09 NOTE — Telephone Encounter (Signed)
She has not been seen since 2019.  Please get her in with ANY provider for refills.

## 2020-11-09 NOTE — Telephone Encounter (Signed)
Pt called back and said that she is unsure if there is something wrong with her BP cuff because when she first checked it, her BP was 163/120 and then when she rechecked it, it was 139/97.  Please advise.

## 2020-11-09 NOTE — Telephone Encounter (Signed)
Patient was last seen on 08/22/18.  States she has been on and off of her bp medication due to her trying home remedies to come off of it.  States she is currently not on her bp but has no more medication.  Has upcoming appointment with Dr. Reece Agar.  States the last time she took her bp 2-3 weeks ago it was 138/120.  Patient is going to call back with a reading from today.

## 2020-11-18 ENCOUNTER — Ambulatory Visit: Payer: Medicaid Other | Admitting: Family Medicine

## 2020-11-25 ENCOUNTER — Ambulatory Visit: Payer: Medicaid Other | Admitting: Family Medicine

## 2020-12-02 DIAGNOSIS — Z79899 Other long term (current) drug therapy: Secondary | ICD-10-CM | POA: Diagnosis not present

## 2020-12-02 DIAGNOSIS — Z87891 Personal history of nicotine dependence: Secondary | ICD-10-CM | POA: Diagnosis not present

## 2020-12-02 DIAGNOSIS — I10 Essential (primary) hypertension: Secondary | ICD-10-CM | POA: Diagnosis not present

## 2020-12-04 ENCOUNTER — Encounter: Payer: Self-pay | Admitting: Family Medicine

## 2020-12-04 ENCOUNTER — Other Ambulatory Visit: Payer: Self-pay

## 2020-12-04 ENCOUNTER — Ambulatory Visit: Payer: Medicaid Other | Admitting: Family Medicine

## 2020-12-04 VITALS — BP 131/84 | HR 67 | Temp 97.8°F | Ht 65.0 in | Wt 215.4 lb

## 2020-12-04 DIAGNOSIS — I10 Essential (primary) hypertension: Secondary | ICD-10-CM

## 2020-12-04 DIAGNOSIS — Z3009 Encounter for other general counseling and advice on contraception: Secondary | ICD-10-CM

## 2020-12-04 DIAGNOSIS — H938X3 Other specified disorders of ear, bilateral: Secondary | ICD-10-CM

## 2020-12-04 DIAGNOSIS — Z09 Encounter for follow-up examination after completed treatment for conditions other than malignant neoplasm: Secondary | ICD-10-CM | POA: Diagnosis not present

## 2020-12-04 LAB — LIPID PANEL
Chol/HDL Ratio: 2.4 ratio (ref 0.0–4.4)
Cholesterol, Total: 136 mg/dL (ref 100–199)
HDL: 56 mg/dL (ref >39)
LDL Chol Calc (NIH): 69 mg/dL (ref 0–99)
Triglycerides: 48 mg/dL (ref 0–149)
VLDL Cholesterol Cal: 11 mg/dL (ref 5–40)

## 2020-12-04 MED ORDER — LISINOPRIL 20 MG PO TABS: 20.0000 mg | ORAL_TABLET | Freq: Every day | ORAL | 1 refills | Status: DC

## 2020-12-04 MED ORDER — FLUTICASONE PROPIONATE 50 MCG/ACT NA SUSP: 2.0000 | Freq: Every day | NASAL | 6 refills | Status: AC

## 2020-12-04 NOTE — Progress Notes (Signed)
Subjective: CC: high BP PCP: Deborah Ip, DO  [id number] Deborah Klein is a 30 y.o. female presenting to clinic today for:  1. High BP Deborah Klein went to the ER 3 days ago for elevated BP. Deborah Klein had previously been on lisinopril for HTN, however Deborah Klein has been out for some time. Deborah Klein was experiencing a headache and blurred vision when Deborah Klein went to the ER. Deborah Klein was given IV labetalol in the ED and sent home on lisinopril 20 mg. Deborah Klein had a normal CBC, CMP, and TSH level. Deborah Klein has been taking the lisinopril daily as prescribed since. Deborah Klein has not been checking her BP at home cause Deborah Klein has been too anxious to do so. Deborah Klein does have a monitor where Deborah Klein can. Deborah Klein denies chest pain, HA, shortness of breath, visual disturbances, dizziness, palpitations, or edema.   2. Ear congestion Deborah Klein also reports bilateral ear popping. This has been going on for some time. Deborah Klein has not tried any remedies. It is worse when Deborah Klein swallows or turns her hear. Deborah Klein denies ear pain, drainage, or changes in her hearing. Deborah Klein denies fever or other allergy or URI symptoms.   3. Birth control Deborah Klein is currently using condoms for contraception. Deborah Klein is interested in longer term birth control options, preferably an IUD. Deborah Klein has tried depo injection and the Nexplanon in the past and is not interested in using these methods again. Deborah Klein denies concern for pregnancy.   Relevant past medical, surgical, family, and social history reviewed and updated as indicated.  Allergies and medications reviewed and updated.  No Known Allergies Past Medical History:  Diagnosis Date  . Anxiety   . Asthma   . Depression   . Hypertension     Current Outpatient Medications:  .  lisinopril (ZESTRIL) 20 MG tablet, Take 20 mg by mouth daily., Disp: , Rfl:  Social History   Socioeconomic History  . Marital status: Single    Spouse name: Not on file  . Number of children: 2  . Years of education: Not on file  . Highest education level: Not  on file  Occupational History  . Not on file  Tobacco Use  . Smoking status: Former Smoker    Packs/day: 0.03    Years: 2.00    Pack years: 0.06    Types: Cigarettes  . Smokeless tobacco: Never Used  Vaping Use  . Vaping Use: Never used  Substance and Sexual Activity  . Alcohol use: No    Comment: social  . Drug use: No  . Sexual activity: Not Currently    Birth control/protection: Implant  Other Topics Concern  . Not on file  Social History Narrative  . Not on file   Social Determinants of Health   Financial Resource Strain: Not on file  Food Insecurity: Not on file  Transportation Needs: Not on file  Physical Activity: Not on file  Stress: Not on file  Social Connections: Not on file  Intimate Partner Violence: Not on file   Family History  Problem Relation Age of Onset  . Depression Mother   . Depression Sister   . Other Brother        killed at age 76  . Hypertension Maternal Grandmother   . Hypertension Maternal Grandfather   . Stroke Maternal Grandfather     Review of Systems  Negative unless indicated in HPI.  Objective: Office vital signs reviewed. BP 131/84   Pulse 67   Temp 97.8 F (36.6 C) (Temporal)   Ht  5\' 5"  (1.651 m)   Wt 215 lb 6 oz (97.7 kg)   BMI 35.84 kg/m   Physical Examination:  Physical Exam Vitals and nursing note reviewed.  Constitutional:      General: Deborah Klein is not in acute distress.    Appearance: Normal appearance. Deborah Klein is not ill-appearing, toxic-appearing or diaphoretic.  HENT:     Head: Normocephalic and atraumatic.     Right Ear: Ear canal and external ear normal. No drainage, swelling or tenderness. A middle ear effusion is present. There is no impacted cerumen. Tympanic membrane is not injected, erythematous, retracted or bulging.     Left Ear: Ear canal and external ear normal. No drainage, swelling or tenderness. A middle ear effusion is present. There is no impacted cerumen. Tympanic membrane is not injected,  erythematous, retracted or bulging.  Eyes:     Extraocular Movements: Extraocular movements intact.     Conjunctiva/sclera: Conjunctivae normal.     Pupils: Pupils are equal, round, and reactive to light.  Cardiovascular:     Rate and Rhythm: Normal rate and regular rhythm.     Heart sounds: Normal heart sounds. No murmur heard.   Pulmonary:     Effort: Pulmonary effort is normal. No respiratory distress.     Breath sounds: Normal breath sounds.  Musculoskeletal:     Right lower leg: No edema.     Left lower leg: No edema.  Skin:    General: Skin is warm and dry.  Neurological:     General: No focal deficit present.     Mental Status: Deborah Klein is alert and oriented to person, place, and time.  Psychiatric:        Mood and Affect: Mood normal.        Behavior: Behavior normal.      Results for orders placed or performed in visit on 08/22/18  Pregnancy, urine  Result Value Ref Range   Preg Test, Ur Negative Negative     Assessment/ Plan: Deborah Klein was seen today for hypertension.  Diagnoses and all orders for this visit:  Primary hypertension BP controlled today. Continue current regimen. Check BP at home. Discussed goal BP of <= 130/80. Deborah Klein will notify provider if home BP is elevated. Dash diet and hypertension handout provided. Exercise encouraged. Fasting lipid panel pending.  -     lisinopril (ZESTRIL) 20 MG tablet; Take 1 tablet (20 mg total) by mouth daily. -     Lipid panel  Congestion of both ears Start flonase daily. May add OTC PO allergy medication such as zyrtec or allegra if needed.  -     fluticasone (FLONASE) 50 MCG/ACT nasal spray; Place 2 sprays into both nostrils daily.  Encounter for other general counseling or advice on contraception Encouraged to schedule appointment with provider if office who is able to place IUD.   Hospital discharge follow up Hospital records reviewed  Keep schedule appointment with PCP on 12/28.  The above assessment and  management plan was discussed with the patient. The patient verbalized understanding of and has agreed to the management plan. Patient is aware to call the clinic if symptoms persist or worsen. Patient is aware when to return to the clinic for a follow-up visit. Patient educated on when it is appropriate to go to the emergency department.   1/29, FNP-C [facility] [address] [place], [place] [phone]

## 2020-12-04 NOTE — Patient Instructions (Signed)
Hypertension, Adult High blood pressure (hypertension) is when the force of blood pumping through the arteries is too strong. The arteries are the blood vessels that carry blood from the heart throughout the body. Hypertension forces the heart to work harder to pump blood and may cause arteries to become narrow or stiff. Untreated or uncontrolled hypertension can cause a heart attack, heart failure, a stroke, kidney disease, and other problems. A blood pressure reading consists of a higher number over a lower number. Ideally, your blood pressure should be below 120/80. The first ("top") number is called the systolic pressure. It is a measure of the pressure in your arteries as your heart beats. The second ("bottom") number is called the diastolic pressure. It is a measure of the pressure in your arteries as the heart relaxes. What are the causes? The exact cause of this condition is not known. There are some conditions that result in or are related to high blood pressure. What increases the risk? Some risk factors for high blood pressure are under your control. The following factors may make you more likely to develop this condition:  Smoking.  Having type 2 diabetes mellitus, high cholesterol, or both.  Not getting enough exercise or physical activity.  Being overweight.  Having too much fat, sugar, calories, or salt (sodium) in your diet.  Drinking too much alcohol. Some risk factors for high blood pressure may be difficult or impossible to change. Some of these factors include:  Having chronic kidney disease.  Having a family history of high blood pressure.  Age. Risk increases with age.  Race. You may be at higher risk if you are African American.  Gender. Men are at higher risk than women before age 45. After age 65, women are at higher risk than men.  Having obstructive sleep apnea.  Stress. What are the signs or symptoms? High blood pressure may not cause symptoms. Very high  blood pressure (hypertensive crisis) may cause:  Headache.  Anxiety.  Shortness of breath.  Nosebleed.  Nausea and vomiting.  Vision changes.  Severe chest pain.  Seizures. How is this diagnosed? This condition is diagnosed by measuring your blood pressure while you are seated, with your arm resting on a flat surface, your legs uncrossed, and your feet flat on the floor. The cuff of the blood pressure monitor will be placed directly against the skin of your upper arm at the level of your heart. It should be measured at least twice using the same arm. Certain conditions can cause a difference in blood pressure between your right and left arms. Certain factors can cause blood pressure readings to be lower or higher than normal for a short period of time:  When your blood pressure is higher when you are in a health care provider's office than when you are at home, this is called white coat hypertension. Most people with this condition do not need medicines.  When your blood pressure is higher at home than when you are in a health care provider's office, this is called masked hypertension. Most people with this condition may need medicines to control blood pressure. If you have a high blood pressure reading during one visit or you have normal blood pressure with other risk factors, you may be asked to:  Return on a different day to have your blood pressure checked again.  Monitor your blood pressure at home for 1 week or longer. If you are diagnosed with hypertension, you may have other blood or   imaging tests to help your health care provider understand your overall risk for other conditions. How is this treated? This condition is treated by making healthy lifestyle changes, such as eating healthy foods, exercising more, and reducing your alcohol intake. Your health care provider may prescribe medicine if lifestyle changes are not enough to get your blood pressure under control, and  if:  Your systolic blood pressure is above 130.  Your diastolic blood pressure is above 80. Your personal target blood pressure may vary depending on your medical conditions, your age, and other factors. Follow these instructions at home: Eating and drinking   Eat a diet that is high in fiber and potassium, and low in sodium, added sugar, and fat. An example eating plan is called the DASH (Dietary Approaches to Stop Hypertension) diet. To eat this way: ? Eat plenty of fresh fruits and vegetables. Try to fill one half of your plate at each meal with fruits and vegetables. ? Eat whole grains, such as whole-wheat pasta, brown rice, or whole-grain bread. Fill about one fourth of your plate with whole grains. ? Eat or drink low-fat dairy products, such as skim milk or low-fat yogurt. ? Avoid fatty cuts of meat, processed or cured meats, and poultry with skin. Fill about one fourth of your plate with lean proteins, such as fish, chicken without skin, beans, eggs, or tofu. ? Avoid pre-made and processed foods. These tend to be higher in sodium, added sugar, and fat.  Reduce your daily sodium intake. Most people with hypertension should eat less than 1,500 mg of sodium a day.  Do not drink alcohol if: ? Your health care provider tells you not to drink. ? You are pregnant, may be pregnant, or are planning to become pregnant.  If you drink alcohol: ? Limit how much you use to:  0-1 drink a day for women.  0-2 drinks a day for men. ? Be aware of how much alcohol is in your drink. In the [place] one drink equals one 12 oz bottle of beer (355 mL), one 5 oz glass of wine (148 mL), or one 1 oz glass of hard liquor (44 mL). Lifestyle   Work with your health care provider to maintain a healthy body weight or to lose weight. Ask what an ideal weight is for you.  Get at least 30 minutes of exercise most days of the week. Activities may include walking, swimming, or biking.  Include exercise to  strengthen your muscles (resistance exercise), such as Pilates or lifting weights, as part of your weekly exercise routine. Try to do these types of exercises for 30 minutes at least 3 days a week.  Do not use any products that contain nicotine or tobacco, such as cigarettes, e-cigarettes, and chewing tobacco. If you need help quitting, ask your health care provider.  Monitor your blood pressure at home as told by your health care provider.  Keep all follow-up visits as told by your health care provider. This is important. Medicines  Take over-the-counter and prescription medicines only as told by your health care provider. Follow directions carefully. Blood pressure medicines must be taken as prescribed.  Do not skip doses of blood pressure medicine. Doing this puts you at risk for problems and can make the medicine less effective.  Ask your health care provider about side effects or reactions to medicines that you should watch for. Contact a health care provider if you:  Think you are having a reaction to a medicine you   are taking.  Have headaches that keep coming back (recurring).  Feel dizzy.  Have swelling in your ankles.  Have trouble with your vision. Get help right away if you:  Develop a severe headache or confusion.  Have unusual weakness or numbness.  Feel faint.  Have severe pain in your chest or abdomen.  Vomit repeatedly.  Have trouble breathing. Summary  Hypertension is when the force of blood pumping through your arteries is too strong. If this condition is not controlled, it may put you at risk for serious complications.  Your personal target blood pressure may vary depending on your medical conditions, your age, and other factors. For most people, a normal blood pressure is less than 120/80.  Hypertension is treated with lifestyle changes, medicines, or a combination of both. Lifestyle changes include losing weight, eating a healthy, low-sodium diet,  exercising more, and limiting alcohol. This information is not intended to replace advice given to you by your health care provider. Make sure you discuss any questions you have with your health care provider. Document Revised: 08/22/2018 Document Reviewed: 08/22/2018 Elsevier Patient Education  2020 Else[employer] Inc. DASH Eating Plan DASH stands for "Dietary Approaches to Stop Hypertension." The DASH eating plan is a healthy eating plan that has been shown to reduce high blood pressure (hypertension). It may also reduce your risk for type 2 diabetes, heart disease, and stroke. The DASH eating plan may also help with weight loss. What are tips for following this plan?  General guidelines  Avoid eating more than 2,300 mg (milligrams) of salt (sodium) a day. If you have hypertension, you may need to reduce your sodium intake to 1,500 mg a day.  Limit alcohol intake to no more than 1 drink a day for nonpregnant women and 2 drinks a day for men. One drink equals 12 oz of beer, 5 oz of wine, or 1 oz of hard liquor.  Work with your health care provider to maintain a healthy body weight or to lose weight. Ask what an ideal weight is for you.  Get at least 30 minutes of exercise that causes your heart to beat faster (aerobic exercise) most days of the week. Activities may include walking, swimming, or biking.  Work with your health care provider or diet and nutrition specialist (dietitian) to adjust your eating plan to your individual calorie needs. Reading food labels   Check food labels for the amount of sodium per serving. Choose foods with less than 5 percent of the Daily Value of sodium. Generally, foods with less than 300 mg of sodium per serving fit into this eating plan.  To find whole grains, look for the word "whole" as the first word in the ingredient list. Shopping  Buy products labeled as "low-sodium" or "no salt added."  Buy fresh foods. Avoid canned foods and premade or frozen  meals. Cooking  Avoid adding salt when cooking. Use salt-free seasonings or herbs instead of table salt or sea salt. Check with your health care provider or pharmacist before using salt substitutes.  Do not fry foods. Cook foods using healthy methods such as baking, boiling, grilling, and broiling instead.  Cook with heart-healthy oils, such as olive, canola, soybean, or sunflower oil. Meal planning  Eat a balanced diet that includes: ? 5 or more servings of fruits and vegetables each day. At each meal, try to fill half of your plate with fruits and vegetables. ? Up to 6-8 servings of whole grains each day. ? Less than 6   oz of lean meat, poultry, or fish each day. A 3-oz serving of meat is about the same size as a deck of cards. One egg equals 1 oz. ? 2 servings of low-fat dairy each day. ? A serving of nuts, seeds, or beans 5 times each week. ? Heart-healthy fats. Healthy fats called Omega-3 fatty acids are found in foods such as flaxseeds and coldwater fish, like sardines, salmon, and mackerel.  Limit how much you eat of the following: ? Canned or prepackaged foods. ? Food that is high in trans fat, such as fried foods. ? Food that is high in saturated fat, such as fatty meat. ? Sweets, desserts, sugary drinks, and other foods with added sugar. ? Full-fat dairy products.  Do not salt foods before eating.  Try to eat at least 2 vegetarian meals each week.  Eat more home-cooked food and less restaurant, buffet, and fast food.  When eating at a restaurant, ask that your food be prepared with less salt or no salt, if possible. What foods are recommended? The items listed may not be a complete list. Talk with your dietitian about what dietary choices are best for you. Grains Whole-grain or whole-wheat bread. Whole-grain or whole-wheat pasta. Brown rice. Oatmeal. Quinoa. Bulgur. Whole-grain and low-sodium cereals. Pita bread. Low-fat, low-sodium crackers. Whole-wheat flour  tortillas. Vegetables Fresh or frozen vegetables (raw, steamed, roasted, or grilled). Low-sodium or reduced-sodium tomato and vegetable juice. Low-sodium or reduced-sodium tomato sauce and tomato paste. Low-sodium or reduced-sodium canned vegetables. Fruits All fresh, dried, or frozen fruit. Canned fruit in natural juice (without added sugar). Meat and other protein foods Skinless chicken or turkey. Ground chicken or turkey. Pork with fat trimmed off. Fish and seafood. Egg whites. Dried beans, peas, or lentils. Unsalted nuts, nut butters, and seeds. Unsalted canned beans. Lean cuts of beef with fat trimmed off. Low-sodium, lean deli meat. Dairy Low-fat (1%) or fat-free (skim) milk. Fat-free, low-fat, or reduced-fat cheeses. Nonfat, low-sodium ricotta or cottage cheese. Low-fat or nonfat yogurt. Low-fat, low-sodium cheese. Fats and oils Soft margarine without trans fats. Vegetable oil. Low-fat, reduced-fat, or light mayonnaise and salad dressings (reduced-sodium). Canola, safflower, olive, soybean, and sunflower oils. Avocado. Seasoning and other foods Herbs. Spices. Seasoning mixes without salt. Unsalted popcorn and pretzels. Fat-free sweets. What foods are not recommended? The items listed may not be a complete list. Talk with your dietitian about what dietary choices are best for you. Grains Baked goods made with fat, such as croissants, muffins, or some breads. Dry pasta or rice meal packs. Vegetables Creamed or fried vegetables. Vegetables in a cheese sauce. Regular canned vegetables (not low-sodium or reduced-sodium). Regular canned tomato sauce and paste (not low-sodium or reduced-sodium). Regular tomato and vegetable juice (not low-sodium or reduced-sodium). Pickles. Olives. Fruits Canned fruit in a light or heavy syrup. Fried fruit. Fruit in cream or butter sauce. Meat and other protein foods Fatty cuts of meat. Ribs. Fried meat. Bacon. Sausage. Bologna and other processed lunch meats.  Salami. Fatback. Hotdogs. Bratwurst. Salted nuts and seeds. Canned beans with added salt. Canned or smoked fish. Whole eggs or egg yolks. Chicken or turkey with skin. Dairy Whole or 2% milk, cream, and half-and-half. Whole or full-fat cream cheese. Whole-fat or sweetened yogurt. Full-fat cheese. Nondairy creamers. Whipped toppings. Processed cheese and cheese spreads. Fats and oils Butter. Stick margarine. Lard. Shortening. Ghee. Bacon fat. Tropical oils, such as coconut, palm kernel, or palm oil. Seasoning and other foods Salted popcorn and pretzels. Onion salt, garlic salt, seasoned   salt, table salt, and sea salt. Worcestershire sauce. Tartar sauce. Barbecue sauce. Teriyaki sauce. Soy sauce, including reduced-sodium. Steak sauce. Canned and packaged gravies. Fish sauce. Oyster sauce. Cocktail sauce. Horseradish that you find on the shelf. Ketchup. Mustard. Meat flavorings and tenderizers. Bouillon cubes. Hot sauce and Tabasco sauce. Premade or packaged marinades. Premade or packaged taco seasonings. Relishes. Regular salad dressings. Where to find more information:  National Heart, Lung, and Blood Institute: www.nhlbi.nih.gov  American Heart Association: www.heart.org Summary  The DASH eating plan is a healthy eating plan that has been shown to reduce high blood pressure (hypertension). It may also reduce your risk for type 2 diabetes, heart disease, and stroke.  With the DASH eating plan, you should limit salt (sodium) intake to 2,300 mg a day. If you have hypertension, you may need to reduce your sodium intake to 1,500 mg a day.  When on the DASH eating plan, aim to eat more fresh fruits and vegetables, whole grains, lean proteins, low-fat dairy, and heart-healthy fats.  Work with your health care provider or diet and nutrition specialist (dietitian) to adjust your eating plan to your individual calorie needs. This information is not intended to replace advice given to you by your health  care provider. Make sure you discuss any questions you have with your health care provider. Document Revised: 11/24/2017 Document Reviewed: 12/05/2016 Elsevier Patient Education  2020 Elsevier Inc.  

## 2020-12-22 ENCOUNTER — Ambulatory Visit: Payer: Medicaid Other | Admitting: Family Medicine

## 2020-12-29 ENCOUNTER — Encounter: Payer: Self-pay | Admitting: Family Medicine

## 2021-01-29 ENCOUNTER — Other Ambulatory Visit: Payer: Self-pay

## 2021-01-29 ENCOUNTER — Ambulatory Visit: Payer: Medicaid Other | Admitting: *Deleted

## 2021-01-29 DIAGNOSIS — I10 Essential (primary) hypertension: Secondary | ICD-10-CM

## 2021-01-29 NOTE — Progress Notes (Signed)
Pt comes in for BP check, says she just made on her own. Provider did not order.  BP today is 133/88

## 2021-02-07 DIAGNOSIS — L03113 Cellulitis of right upper limb: Secondary | ICD-10-CM | POA: Diagnosis not present

## 2021-02-07 DIAGNOSIS — Z87891 Personal history of nicotine dependence: Secondary | ICD-10-CM | POA: Diagnosis not present

## 2021-02-07 DIAGNOSIS — L03011 Cellulitis of right finger: Secondary | ICD-10-CM | POA: Diagnosis not present

## 2021-04-20 DIAGNOSIS — F341 Dysthymic disorder: Secondary | ICD-10-CM | POA: Diagnosis not present

## 2021-04-20 DIAGNOSIS — F411 Generalized anxiety disorder: Secondary | ICD-10-CM | POA: Diagnosis not present

## 2021-04-20 DIAGNOSIS — Z3200 Encounter for pregnancy test, result unknown: Secondary | ICD-10-CM | POA: Diagnosis not present

## 2021-04-20 DIAGNOSIS — I10 Essential (primary) hypertension: Secondary | ICD-10-CM | POA: Diagnosis not present

## 2021-04-20 DIAGNOSIS — F17210 Nicotine dependence, cigarettes, uncomplicated: Secondary | ICD-10-CM | POA: Diagnosis not present

## 2021-05-01 DIAGNOSIS — R059 Cough, unspecified: Secondary | ICD-10-CM | POA: Diagnosis not present

## 2021-05-01 DIAGNOSIS — R52 Pain, unspecified: Secondary | ICD-10-CM | POA: Diagnosis not present

## 2021-05-01 DIAGNOSIS — R197 Diarrhea, unspecified: Secondary | ICD-10-CM | POA: Diagnosis not present

## 2021-05-01 DIAGNOSIS — R0989 Other specified symptoms and signs involving the circulatory and respiratory systems: Secondary | ICD-10-CM | POA: Diagnosis not present

## 2021-05-01 DIAGNOSIS — R432 Parageusia: Secondary | ICD-10-CM | POA: Diagnosis not present

## 2021-05-01 DIAGNOSIS — J029 Acute pharyngitis, unspecified: Secondary | ICD-10-CM | POA: Diagnosis not present

## 2021-06-06 DIAGNOSIS — R9431 Abnormal electrocardiogram [ECG] [EKG]: Secondary | ICD-10-CM | POA: Diagnosis not present

## 2021-06-06 DIAGNOSIS — Z87891 Personal history of nicotine dependence: Secondary | ICD-10-CM | POA: Diagnosis not present

## 2021-06-06 DIAGNOSIS — I10 Essential (primary) hypertension: Secondary | ICD-10-CM | POA: Diagnosis not present

## 2021-06-06 DIAGNOSIS — R519 Headache, unspecified: Secondary | ICD-10-CM | POA: Diagnosis not present

## 2021-06-06 DIAGNOSIS — U071 COVID-19: Secondary | ICD-10-CM | POA: Diagnosis not present

## 2021-06-07 ENCOUNTER — Telehealth: Payer: Self-pay | Admitting: *Deleted

## 2021-06-07 NOTE — Telephone Encounter (Signed)
Transition Care Management Unsuccessful Follow-up Telephone Call  Date of discharge and from where:  06/06/2021 [facility]  Attempts:  1st Attempt  Reason for unsuccessful TCM follow-up call:  Left voice message

## 2021-06-08 NOTE — Telephone Encounter (Signed)
Transition Care Management Follow-up Telephone Call Date of discharge and from where: 06/06/2021 [facility] ED How have you been since you were released from the hospital? "Okay" Any questions or concerns? No  Items Reviewed: Did the pt receive and understand the discharge instructions provided? Yes  Medications obtained and verified? Yes  Other? No  Any new allergies since your discharge? No  Dietary orders reviewed? No Do you have support at home? Yes    Functional Questionnaire: (I = Independent and D = Dependent) ADLs: I  Bathing/Dressing- I  Meal Prep- I  Eating- I  Maintaining continence- I  Transferring/Ambulation- I  Managing Meds- I  Follow up appointments reviewed:  PCP Hospital f/u appt confirmed? No   Specialist Hospital f/u appt confirmed? No   Are transportation arrangements needed? No  If their condition worsens, is the pt aware to call PCP or go to the Emergency Dept.? Yes Was the patient provided with contact information for the PCP's office or ED? Yes Was to pt encouraged to call back with questions or concerns? Yes

## 2021-06-14 DIAGNOSIS — Z20822 Contact with and (suspected) exposure to covid-19: Secondary | ICD-10-CM | POA: Diagnosis not present

## 2021-06-14 DIAGNOSIS — U071 COVID-19: Secondary | ICD-10-CM | POA: Diagnosis not present

## 2021-06-16 DIAGNOSIS — U071 COVID-19: Secondary | ICD-10-CM | POA: Diagnosis not present

## 2021-06-16 DIAGNOSIS — Z6835 Body mass index (BMI) 35.0-35.9, adult: Secondary | ICD-10-CM | POA: Diagnosis not present

## 2021-07-09 ENCOUNTER — Other Ambulatory Visit: Payer: Self-pay | Admitting: Family Medicine

## 2021-07-09 DIAGNOSIS — I10 Essential (primary) hypertension: Secondary | ICD-10-CM

## 2021-11-24 DIAGNOSIS — J069 Acute upper respiratory infection, unspecified: Secondary | ICD-10-CM | POA: Diagnosis not present

## 2021-11-24 DIAGNOSIS — H9203 Otalgia, bilateral: Secondary | ICD-10-CM | POA: Diagnosis not present

## 2021-11-29 DIAGNOSIS — Z1152 Encounter for screening for COVID-19: Secondary | ICD-10-CM | POA: Diagnosis not present

## 2021-11-29 DIAGNOSIS — Z9189 Other specified personal risk factors, not elsewhere classified: Secondary | ICD-10-CM | POA: Diagnosis not present

## 2022-03-27 ENCOUNTER — Other Ambulatory Visit: Payer: Self-pay | Admitting: Family Medicine

## 2022-03-27 DIAGNOSIS — I10 Essential (primary) hypertension: Secondary | ICD-10-CM

## 2022-04-06 ENCOUNTER — Other Ambulatory Visit: Payer: Self-pay | Admitting: Family Medicine

## 2022-04-06 DIAGNOSIS — I10 Essential (primary) hypertension: Secondary | ICD-10-CM

## 2022-04-23 DIAGNOSIS — Z0000 Encounter for general adult medical examination without abnormal findings: Secondary | ICD-10-CM | POA: Diagnosis not present

## 2022-04-23 DIAGNOSIS — E559 Vitamin D deficiency, unspecified: Secondary | ICD-10-CM | POA: Diagnosis not present

## 2022-04-23 DIAGNOSIS — Z6837 Body mass index (BMI) 37.0-37.9, adult: Secondary | ICD-10-CM | POA: Diagnosis not present

## 2022-04-23 DIAGNOSIS — Z1159 Encounter for screening for other viral diseases: Secondary | ICD-10-CM | POA: Diagnosis not present

## 2022-09-29 DIAGNOSIS — I10 Essential (primary) hypertension: Secondary | ICD-10-CM | POA: Diagnosis not present

## 2022-09-29 DIAGNOSIS — F419 Anxiety disorder, unspecified: Secondary | ICD-10-CM | POA: Diagnosis not present

## 2022-09-29 DIAGNOSIS — Z79899 Other long term (current) drug therapy: Secondary | ICD-10-CM | POA: Diagnosis not present

## 2022-09-29 DIAGNOSIS — J45909 Unspecified asthma, uncomplicated: Secondary | ICD-10-CM | POA: Diagnosis not present

## 2022-09-29 DIAGNOSIS — E559 Vitamin D deficiency, unspecified: Secondary | ICD-10-CM | POA: Diagnosis not present

## 2022-12-29 DIAGNOSIS — S39012A Strain of muscle, fascia and tendon of lower back, initial encounter: Secondary | ICD-10-CM | POA: Diagnosis not present

## 2022-12-29 DIAGNOSIS — Z87891 Personal history of nicotine dependence: Secondary | ICD-10-CM | POA: Diagnosis not present

## 2022-12-29 DIAGNOSIS — X501XXA Overexertion from prolonged static or awkward postures, initial encounter: Secondary | ICD-10-CM | POA: Diagnosis not present

## 2022-12-29 DIAGNOSIS — N390 Urinary tract infection, site not specified: Secondary | ICD-10-CM | POA: Diagnosis not present

## 2023-08-12 ENCOUNTER — Encounter (HOSPITAL_COMMUNITY): Payer: Self-pay

## 2023-08-12 ENCOUNTER — Emergency Department (HOSPITAL_COMMUNITY)
Admission: EM | Admit: 2023-08-12 | Discharge: 2023-08-12 | Disposition: A | Discharge status: Home / Self Care | Payer: No Typology Code available for payment source | Attending: Emergency Medicine | Admitting: Emergency Medicine

## 2023-08-12 ENCOUNTER — Other Ambulatory Visit: Payer: Self-pay

## 2023-08-12 ENCOUNTER — Emergency Department (HOSPITAL_COMMUNITY): Payer: No Typology Code available for payment source

## 2023-08-12 DIAGNOSIS — S0012XA Contusion of left eyelid and periocular area, initial encounter: Secondary | ICD-10-CM | POA: Insufficient documentation

## 2023-08-12 DIAGNOSIS — S0083XA Contusion of other part of head, initial encounter: Secondary | ICD-10-CM | POA: Insufficient documentation

## 2023-08-12 DIAGNOSIS — J45909 Unspecified asthma, uncomplicated: Secondary | ICD-10-CM | POA: Insufficient documentation

## 2023-08-12 DIAGNOSIS — S0011XA Contusion of right eyelid and periocular area, initial encounter: Secondary | ICD-10-CM | POA: Diagnosis not present

## 2023-08-12 DIAGNOSIS — S80211A Abrasion, right knee, initial encounter: Secondary | ICD-10-CM | POA: Diagnosis not present

## 2023-08-12 DIAGNOSIS — I10 Essential (primary) hypertension: Secondary | ICD-10-CM | POA: Insufficient documentation

## 2023-08-12 DIAGNOSIS — S80212A Abrasion, left knee, initial encounter: Secondary | ICD-10-CM | POA: Insufficient documentation

## 2023-08-12 MED ORDER — OXYCODONE-ACETAMINOPHEN 5-325 MG PO TABS
1.0000 | ORAL_TABLET | Freq: Once | ORAL | Status: AC
  Administered 2023-08-12: 1 via ORAL
  Filled 2023-08-12: qty 1
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
## 2023-08-12 NOTE — Discharge Instructions (Signed)
Your CT's and x-ray were normal, no bony injuries found. You will likely be sore for several days, Tylenol or Motrin.  Can continue applying ice to the face to keep swelling down. Follow-up with your primary care doctor. Return here for any new or acute changes.

## 2023-08-12 NOTE — ED Notes (Signed)
Patient transported to CT 
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
## 2023-08-23 ENCOUNTER — Encounter: Payer: Self-pay | Admitting: Neurology

## 2024-01-16 ENCOUNTER — Ambulatory Visit: Payer: Self-pay | Admitting: Neurology

## 2024-02-29 ENCOUNTER — Institutional Professional Consult (permissible substitution): Payer: Self-pay | Admitting: Plastic Surgery

## 2024-03-28 ENCOUNTER — Institutional Professional Consult (permissible substitution): Admitting: Plastic Surgery

## 2024-04-09 ENCOUNTER — Encounter (HOSPITAL_COMMUNITY): Payer: Self-pay | Admitting: Emergency Medicine

## 2024-04-09 ENCOUNTER — Other Ambulatory Visit: Payer: Self-pay

## 2024-04-09 ENCOUNTER — Emergency Department (HOSPITAL_COMMUNITY)
Admission: EM | Admit: 2024-04-09 | Discharge: 2024-04-09 | Discharge status: Against Medical Advice | Attending: Emergency Medicine | Admitting: Emergency Medicine

## 2024-04-09 DIAGNOSIS — R519 Headache, unspecified: Secondary | ICD-10-CM | POA: Insufficient documentation

## 2024-04-09 DIAGNOSIS — H5710 Ocular pain, unspecified eye: Secondary | ICD-10-CM | POA: Insufficient documentation

## 2024-04-09 DIAGNOSIS — Z5321 Procedure and treatment not carried out due to patient leaving prior to being seen by health care provider: Secondary | ICD-10-CM | POA: Insufficient documentation

## 2024-04-09 NOTE — ED Triage Notes (Signed)
 Pt presents with headache and facial pain since Saturday, seen at [facility] in [place] on yesterday and diagnosed with sinus infection has taken two doses of Amoxicillin.

## 2024-04-16 ENCOUNTER — Telehealth: Payer: Self-pay | Admitting: Plastic Surgery

## 2024-04-16 NOTE — Telephone Encounter (Signed)
Called and left a Vm

## 2024-04-29 ENCOUNTER — Institutional Professional Consult (permissible substitution): Admitting: Plastic Surgery

## 2024-05-16 ENCOUNTER — Institutional Professional Consult (permissible substitution): Admitting: Plastic Surgery
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
## 2024-05-21 ENCOUNTER — Encounter: Payer: Self-pay | Admitting: Neurology

## 2024-05-21 ENCOUNTER — Ambulatory Visit: Payer: Self-pay | Admitting: Neurology

## 2024-05-21 DIAGNOSIS — Z029 Encounter for administrative examinations, unspecified: Secondary | ICD-10-CM

## 2024-07-16 ENCOUNTER — Telehealth: Payer: Self-pay | Admitting: Family Medicine

## 2024-07-16 NOTE — Telephone Encounter (Unsigned)
 Copied from CRM [id number]. Topic: Medical Record Request - Records Request >> Jul 16, 2024 10:12 AM Harlene ORN wrote: Reason for CRM: called for two weeks to get her medical records from her previous Practice.  Says that she has checked her email address several times but it's not there.  Was a patient of Dr. Annabella Search in 2021. Please advise.

## 2024-07-16 NOTE — Telephone Encounter (Signed)
 Pt will come to office to sign release.

## 2024-09-11 ENCOUNTER — Institutional Professional Consult (permissible substitution): Admitting: Plastic Surgery

## 2024-09-25 ENCOUNTER — Institutional Professional Consult (permissible substitution): Admitting: Plastic Surgery
